# Patient Record
Sex: Male | Born: 1980 | Race: White | Hispanic: No | Marital: Married | Smoking: Never smoker
Health system: Southern US, Community
[De-identification: ages and names within clinical notes are randomized; demographics above are authoritative.]

## PROBLEM LIST (undated history)

## (undated) DIAGNOSIS — G4700 Insomnia, unspecified: Secondary | ICD-10-CM

## (undated) DIAGNOSIS — K219 Gastro-esophageal reflux disease without esophagitis: Secondary | ICD-10-CM

## (undated) DIAGNOSIS — N179 Acute kidney failure, unspecified: Secondary | ICD-10-CM

## (undated) DIAGNOSIS — R569 Unspecified convulsions: Secondary | ICD-10-CM

## (undated) DIAGNOSIS — R290 Tetany: Secondary | ICD-10-CM

## (undated) DIAGNOSIS — M5127 Other intervertebral disc displacement, lumbosacral region: Secondary | ICD-10-CM

## (undated) DIAGNOSIS — F4310 Post-traumatic stress disorder, unspecified: Secondary | ICD-10-CM

## (undated) DIAGNOSIS — E785 Hyperlipidemia, unspecified: Secondary | ICD-10-CM

## (undated) DIAGNOSIS — T754XXA Electrocution, initial encounter: Secondary | ICD-10-CM

## (undated) DIAGNOSIS — E039 Hypothyroidism, unspecified: Secondary | ICD-10-CM

## (undated) DIAGNOSIS — A419 Sepsis, unspecified organism: Secondary | ICD-10-CM

## (undated) DIAGNOSIS — M792 Neuralgia and neuritis, unspecified: Secondary | ICD-10-CM

## (undated) DIAGNOSIS — H532 Diplopia: Secondary | ICD-10-CM

## (undated) HISTORY — PX: LASIK: SHX215

---

## 1999-12-15 HISTORY — PX: CRANIOTOMY: SHX93

## 2006-02-15 ENCOUNTER — Emergency Department: Payer: Self-pay | Admitting: Unknown Physician Specialty

## 2006-07-20 ENCOUNTER — Emergency Department: Payer: Self-pay | Admitting: Emergency Medicine

## 2006-07-20 ENCOUNTER — Other Ambulatory Visit: Payer: Self-pay

## 2007-11-18 ENCOUNTER — Emergency Department: Payer: Self-pay | Admitting: Emergency Medicine

## 2008-03-31 ENCOUNTER — Ambulatory Visit: Payer: Self-pay | Admitting: Family Medicine

## 2008-04-04 ENCOUNTER — Ambulatory Visit: Payer: Self-pay

## 2010-03-25 ENCOUNTER — Emergency Department: Payer: Self-pay | Admitting: Emergency Medicine

## 2012-08-23 ENCOUNTER — Encounter: Payer: Self-pay | Admitting: Cardiovascular Disease

## 2014-02-17 ENCOUNTER — Ambulatory Visit: Payer: Self-pay | Admitting: Neurology

## 2014-07-04 ENCOUNTER — Telehealth: Payer: Self-pay | Admitting: Cardiovascular Disease

## 2014-07-04 NOTE — Telephone Encounter (Signed)
Per medical records, patient has not been seen since 07/22/2006  Spoke with patient and he c/o chest tightness on April 10th PM and 11th AM [place]. He wondered if it was related to the chlorine in the air? He states he felt like something was sitting on his chest. Patient was at rest when he noticed this. Patient noticed this in both pectoral muscles  Patient c/o more SOB than before - pollen or something else?  Patient c/o left chest discomfort that is reproducible with movement.  Patient reports he had some chest pain last nite when lying down - described as pain when he rolled over and hugged a pillow.   Dr. Hyacinth MeekerMiller (MD with [facility] [employer]) put patient on pravastatin about 1 month ago but was advised to hold this until further notice PCP Dr. Hyacinth MeekerMiller with [facility] [employer] can be contacted at [phone] if needed  Patient's father sees Dr. Allyson SabalBerry   Other medications  >>  testosterone 0.334ml injection Crissie FiguresQWeekly (Dr. Sharl MaKerr), pravastatin 40mg , was told to take fish oil and OTC acid reducer (takes prn)   Patient was originally scheduled on 07/26/14 with B. Sharol HarnessSimmons, [place] but patient will be a "new patient" to the practice and prefers to see Dr. Allyson SabalBerry  Will defer to Samara DeistKathryn, RN to see if there is any flexibility with adding on to Dr. Hazle CocaBerry's scheduled (4/29 8-830am is open)

## 2014-07-04 NOTE — Telephone Encounter (Signed)
Patient states he feels like something is sitting on his chest along with tightness.

## 2014-07-05 NOTE — Telephone Encounter (Signed)
Patient spoke with PCP who wanted him evaluated sooner, despite RN informing him that there was the possibility of Dr Allyson SabalBerry being able to see him sooner than June. Patient has been scheduled to see a cardiologist in [place] today at 150pm. He would like to continue any further cardiology care with Dr. Allyson SabalBerry

## 2014-07-05 NOTE — Telephone Encounter (Signed)
lmom 

## 2014-07-06 ENCOUNTER — Ambulatory Visit: Payer: Self-pay | Admitting: Cardiovascular Disease

## 2014-07-26 ENCOUNTER — Ambulatory Visit: Payer: Self-pay | Admitting: Cardiology

## 2019-04-12 ENCOUNTER — Ambulatory Visit: Payer: BC Managed Care – PPO | Attending: Internal Medicine

## 2019-04-12 DIAGNOSIS — Z20822 Contact with and (suspected) exposure to covid-19: Secondary | ICD-10-CM | POA: Insufficient documentation

## 2019-04-13 LAB — NOVEL CORONAVIRUS, NAA: SARS-CoV-2, NAA: NOT DETECTED

## 2019-10-06 ENCOUNTER — Other Ambulatory Visit: Payer: Self-pay | Admitting: Internal Medicine

## 2019-10-06 ENCOUNTER — Other Ambulatory Visit: Payer: Self-pay

## 2019-10-06 ENCOUNTER — Ambulatory Visit
Admission: RE | Admit: 2019-10-06 | Discharge: 2019-10-06 | Disposition: A | Discharge status: Home / Self Care | Payer: No Typology Code available for payment source | Source: Ambulatory Visit | Attending: Internal Medicine | Admitting: Internal Medicine

## 2019-10-06 ENCOUNTER — Other Ambulatory Visit (HOSPITAL_COMMUNITY): Payer: Self-pay | Admitting: Internal Medicine

## 2019-10-06 DIAGNOSIS — R1031 Right lower quadrant pain: Secondary | ICD-10-CM | POA: Diagnosis present

## 2019-10-06 DIAGNOSIS — K3531 Acute appendicitis with localized peritonitis and gangrene, without perforation: Secondary | ICD-10-CM | POA: Insufficient documentation

## 2019-10-06 MED ORDER — IOHEXOL 300 MG/ML  SOLN
100.0000 mL | Freq: Once | INTRAMUSCULAR | Status: AC | PRN
  Administered 2019-10-06: 100 mL via INTRAVENOUS

## 2019-11-17 ENCOUNTER — Emergency Department: Payer: Worker's Compensation

## 2019-11-17 ENCOUNTER — Inpatient Hospital Stay
Admission: EM | Admit: 2019-11-17 | Discharge: 2019-11-21 | DRG: 520 | Disposition: A | Discharge status: Home / Self Care | Payer: Worker's Compensation | Attending: Internal Medicine | Admitting: Internal Medicine

## 2019-11-17 ENCOUNTER — Other Ambulatory Visit: Payer: Self-pay

## 2019-11-17 ENCOUNTER — Encounter: Payer: Self-pay | Admitting: Internal Medicine

## 2019-11-17 DIAGNOSIS — M4727 Other spondylosis with radiculopathy, lumbosacral region: Secondary | ICD-10-CM | POA: Diagnosis present

## 2019-11-17 DIAGNOSIS — E860 Dehydration: Secondary | ICD-10-CM | POA: Diagnosis present

## 2019-11-17 DIAGNOSIS — M5127 Other intervertebral disc displacement, lumbosacral region: Secondary | ICD-10-CM

## 2019-11-17 DIAGNOSIS — M5116 Intervertebral disc disorders with radiculopathy, lumbar region: Secondary | ICD-10-CM | POA: Diagnosis present

## 2019-11-17 DIAGNOSIS — Z83438 Family history of other disorder of lipoprotein metabolism and other lipidemia: Secondary | ICD-10-CM

## 2019-11-17 DIAGNOSIS — D72829 Elevated white blood cell count, unspecified: Secondary | ICD-10-CM | POA: Diagnosis not present

## 2019-11-17 DIAGNOSIS — M5126 Other intervertebral disc displacement, lumbar region: Secondary | ICD-10-CM

## 2019-11-17 DIAGNOSIS — M5117 Intervertebral disc disorders with radiculopathy, lumbosacral region: Principal | ICD-10-CM | POA: Diagnosis present

## 2019-11-17 DIAGNOSIS — E785 Hyperlipidemia, unspecified: Secondary | ICD-10-CM | POA: Diagnosis present

## 2019-11-17 DIAGNOSIS — M5410 Radiculopathy, site unspecified: Secondary | ICD-10-CM

## 2019-11-17 DIAGNOSIS — Z885 Allergy status to narcotic agent status: Secondary | ICD-10-CM

## 2019-11-17 DIAGNOSIS — Z419 Encounter for procedure for purposes other than remedying health state, unspecified: Secondary | ICD-10-CM

## 2019-11-17 DIAGNOSIS — Z79899 Other long term (current) drug therapy: Secondary | ICD-10-CM

## 2019-11-17 DIAGNOSIS — M4726 Other spondylosis with radiculopathy, lumbar region: Secondary | ICD-10-CM | POA: Diagnosis present

## 2019-11-17 DIAGNOSIS — Z20822 Contact with and (suspected) exposure to covid-19: Secondary | ICD-10-CM | POA: Diagnosis present

## 2019-11-17 DIAGNOSIS — Z79890 Hormone replacement therapy (postmenopausal): Secondary | ICD-10-CM

## 2019-11-17 DIAGNOSIS — T380X5A Adverse effect of glucocorticoids and synthetic analogues, initial encounter: Secondary | ICD-10-CM | POA: Diagnosis not present

## 2019-11-17 DIAGNOSIS — M545 Low back pain: Secondary | ICD-10-CM | POA: Diagnosis not present

## 2019-11-17 LAB — BASIC METABOLIC PANEL
Anion gap: 12 (ref 5–15)
BUN: 14 mg/dL (ref 6–20)
CO2: 23 mmol/L (ref 22–32)
Calcium: 9.1 mg/dL (ref 8.9–10.3)
Chloride: 99 mmol/L (ref 98–111)
Creatinine, Ser: 1.06 mg/dL (ref 0.61–1.24)
GFR calc Af Amer: >60 mL/min (ref >60)
GFR calc non Af Amer: >60 mL/min (ref >60)
Glucose, Bld: 163 mg/dL — ABNORMAL HIGH (ref 70–99)
Potassium: 3.8 mmol/L (ref 3.5–5.1)
Sodium: 134 mmol/L — ABNORMAL LOW (ref 135–145)

## 2019-11-17 LAB — CBC WITH DIFFERENTIAL/PLATELET
Abs Immature Granulocytes: 0.05 10*3/uL (ref 0.00–0.07)
Basophils Absolute: 0 10*3/uL (ref 0.0–0.1)
Basophils Relative: 0 %
Eosinophils Absolute: 0.1 10*3/uL (ref 0.0–0.5)
Eosinophils Relative: 0 %
HCT: 48.4 % (ref 39.0–52.0)
Hemoglobin: 17.2 g/dL — ABNORMAL HIGH (ref 13.0–17.0)
Immature Granulocytes: 0 %
Lymphocytes Relative: 6 %
Lymphs Abs: 0.8 10*3/uL (ref 0.7–4.0)
MCH: 32.3 pg (ref 26.0–34.0)
MCHC: 35.5 g/dL (ref 30.0–36.0)
MCV: 91 fL (ref 80.0–100.0)
Monocytes Absolute: 0 10*3/uL — ABNORMAL LOW (ref 0.1–1.0)
Monocytes Relative: 0 %
Neutro Abs: 11.4 10*3/uL — ABNORMAL HIGH (ref 1.7–7.7)
Neutrophils Relative %: 94 %
Platelets: 255 10*3/uL (ref 150–400)
RBC: 5.32 MIL/uL (ref 4.22–5.81)
RDW: 12.9 % (ref 11.5–15.5)
WBC: 12.3 10*3/uL — ABNORMAL HIGH (ref 4.0–10.5)
nRBC: 0 % (ref 0.0–0.2)

## 2019-11-17 LAB — SARS CORONAVIRUS 2 BY RT PCR (HOSPITAL ORDER, PERFORMED IN ~~LOC~~ HOSPITAL LAB): SARS Coronavirus 2: NEGATIVE

## 2019-11-17 LAB — MAGNESIUM: Magnesium: 2 mg/dL (ref 1.7–2.4)

## 2019-11-17 MED ORDER — ENOXAPARIN SODIUM 40 MG/0.4ML ~~LOC~~ SOLN
40.0000 mg | SUBCUTANEOUS | Status: DC
  Administered 2019-11-17 – 2019-11-20 (×4): 40 mg via SUBCUTANEOUS
  Filled 2019-11-17 (×4): qty 0.4

## 2019-11-17 MED ORDER — METHYLPREDNISOLONE 4 MG PO TBPK: 4.0000 mg | ORAL_TABLET | ORAL | Status: DC

## 2019-11-17 MED ORDER — ONDANSETRON HCL 4 MG/2ML IJ SOLN
4.0000 mg | Freq: Four times a day (QID) | INTRAMUSCULAR | Status: DC | PRN
  Administered 2019-11-18 – 2019-11-20 (×3): 4 mg via INTRAVENOUS
  Filled 2019-11-17 (×2): qty 2

## 2019-11-17 MED ORDER — METHYLPREDNISOLONE 4 MG PO TBPK: 4.0000 mg | ORAL_TABLET | Freq: Four times a day (QID) | ORAL | Status: DC

## 2019-11-17 MED ORDER — FENTANYL CITRATE (PF) 100 MCG/2ML IJ SOLN
50.0000 ug | Freq: Once | INTRAMUSCULAR | Status: AC
  Administered 2019-11-17: 50 ug via INTRAVENOUS
  Filled 2019-11-17: qty 2

## 2019-11-17 MED ORDER — MAGNESIUM CITRATE PO SOLN
1.0000 | Freq: Once | ORAL | Status: DC | PRN
  Filled 2019-11-17: qty 296

## 2019-11-17 MED ORDER — SORBITOL 70 % SOLN
30.0000 mL | Freq: Every day | Status: DC | PRN
  Filled 2019-11-17: qty 30

## 2019-11-17 MED ORDER — ONDANSETRON HCL 4 MG PO TABS
4.0000 mg | ORAL_TABLET | Freq: Four times a day (QID) | ORAL | Status: DC | PRN
  Filled 2019-11-17: qty 1

## 2019-11-17 MED ORDER — METHYLPREDNISOLONE 4 MG PO TBPK: 8.0000 mg | ORAL_TABLET | Freq: Every morning | ORAL | Status: DC

## 2019-11-17 MED ORDER — METHYLPREDNISOLONE 4 MG PO TBPK: 4.0000 mg | ORAL_TABLET | Freq: Three times a day (TID) | ORAL | Status: DC

## 2019-11-17 MED ORDER — ORPHENADRINE CITRATE 30 MG/ML IJ SOLN
60.0000 mg | Freq: Two times a day (BID) | INTRAMUSCULAR | Status: DC
  Administered 2019-11-17: 60 mg via INTRAMUSCULAR
  Filled 2019-11-17: qty 2

## 2019-11-17 MED ORDER — SENNA 8.6 MG PO TABS
1.0000 | ORAL_TABLET | Freq: Two times a day (BID) | ORAL | Status: DC
  Administered 2019-11-17 – 2019-11-21 (×6): 8.6 mg via ORAL
  Filled 2019-11-17 (×7): qty 1

## 2019-11-17 MED ORDER — PANTOPRAZOLE SODIUM 40 MG PO TBEC
40.0000 mg | DELAYED_RELEASE_TABLET | Freq: Every day | ORAL | Status: DC
  Administered 2019-11-17 – 2019-11-21 (×4): 40 mg via ORAL
  Filled 2019-11-17 (×4): qty 1

## 2019-11-17 MED ORDER — METHYLPREDNISOLONE 4 MG PO TBPK
8.0000 mg | ORAL_TABLET | Freq: Every evening | ORAL | Status: DC
Start: 2019-11-17 — End: 2019-11-17

## 2019-11-17 MED ORDER — METHYLPREDNISOLONE 4 MG PO TBPK
8.0000 mg | ORAL_TABLET | Freq: Every evening | ORAL | Status: DC
Start: 2019-11-18 — End: 2019-11-17

## 2019-11-17 MED ORDER — POLYETHYLENE GLYCOL 3350 17 G PO PACK: 17.0000 g | PACK | Freq: Every day | ORAL | Status: DC | PRN

## 2019-11-17 MED ORDER — PREDNISONE 20 MG PO TABS
40.0000 mg | ORAL_TABLET | Freq: Every day | ORAL | Status: AC
  Administered 2019-11-18 – 2019-11-20 (×3): 40 mg via ORAL
  Filled 2019-11-17 (×3): qty 2

## 2019-11-17 MED ORDER — METHOCARBAMOL 1000 MG/10ML IJ SOLN
500.0000 mg | Freq: Four times a day (QID) | INTRAVENOUS | Status: DC | PRN
  Filled 2019-11-17 (×3): qty 5

## 2019-11-17 MED ORDER — FENTANYL CITRATE (PF) 100 MCG/2ML IJ SOLN
100.0000 ug | Freq: Once | INTRAMUSCULAR | Status: AC
  Administered 2019-11-17: 100 ug via INTRAVENOUS
  Filled 2019-11-17: qty 2

## 2019-11-17 MED ORDER — HYDRALAZINE HCL 20 MG/ML IJ SOLN: 10.0000 mg | Freq: Four times a day (QID) | INTRAMUSCULAR | Status: DC | PRN

## 2019-11-17 MED ORDER — PREGABALIN 75 MG PO CAPS
75.0000 mg | ORAL_CAPSULE | Freq: Two times a day (BID) | ORAL | Status: DC
  Administered 2019-11-17 – 2019-11-21 (×6): 75 mg via ORAL
  Filled 2019-11-17 (×7): qty 1

## 2019-11-17 MED ORDER — METHYLPREDNISOLONE SODIUM SUCC 125 MG IJ SOLR
125.0000 mg | Freq: Once | INTRAMUSCULAR | Status: AC
  Administered 2019-11-17: 125 mg via INTRAMUSCULAR
  Filled 2019-11-17: qty 2

## 2019-11-17 MED ORDER — METHYLPREDNISOLONE SODIUM SUCC 125 MG IJ SOLR
60.0000 mg | Freq: Once | INTRAMUSCULAR | Status: AC
  Administered 2019-11-17: 60 mg via INTRAVENOUS
  Filled 2019-11-17: qty 2

## 2019-11-17 MED ORDER — KETOROLAC TROMETHAMINE 30 MG/ML IJ SOLN
30.0000 mg | Freq: Four times a day (QID) | INTRAMUSCULAR | Status: DC | PRN
  Administered 2019-11-17 – 2019-11-20 (×8): 30 mg via INTRAVENOUS
  Filled 2019-11-17 (×8): qty 1

## 2019-11-17 MED ORDER — IBUPROFEN 400 MG PO TABS
800.0000 mg | ORAL_TABLET | Freq: Three times a day (TID) | ORAL | Status: DC
  Administered 2019-11-17: 800 mg via ORAL
  Filled 2019-11-17: qty 1

## 2019-11-17 MED ORDER — ROSUVASTATIN CALCIUM 10 MG PO TABS
20.0000 mg | ORAL_TABLET | Freq: Every evening | ORAL | Status: DC
  Administered 2019-11-18 – 2019-11-20 (×3): 20 mg via ORAL
  Filled 2019-11-17 (×2): qty 2
  Filled 2019-11-17: qty 1
  Filled 2019-11-17: qty 2

## 2019-11-17 MED ORDER — ONDANSETRON HCL 4 MG/2ML IJ SOLN
4.0000 mg | Freq: Once | INTRAMUSCULAR | Status: AC
  Administered 2019-11-17: 4 mg via INTRAVENOUS
  Filled 2019-11-17: qty 2

## 2019-11-17 NOTE — ED Notes (Signed)
See triage note  Presents via EMS with back pain  Pain became worse last pm  Hx of bulging discs
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
## 2019-11-17 NOTE — Consult Note (Signed)
Referring Physician:  No referring provider defined for this encounter.  Primary Physician:  Danella Penton, MD  Chief Complaint:  Lower back pain and RLE pain  History of Present Illness: Jason Conner is a 39 y.o. male who presents with the chief complaint of LBP and RLE radiculopathy. He reports that he had been going about his normal routine and yesterday, he felt a sharp pain in his lower back with immediate shooting, burning pain down his right posterior thigh, calf, and into his right foot.  He reports that the pain was so intense that he was unable to hold himself up on his right leg.  Since that time, he has been unable to find a position of comfort.  He feels that any movement to his right lower extremity causes increase in his lower back and right lower extremity pain.  He denies any overt weakness, however he is avoiding moving his right leg due to the severe pain that he is experiencing.  He does endorse a similar episode of this in July of last year, he was treated at [facility] with steroid injections which he said did help, and he underwent physical therapy and since that time he had been doing well.  Denies any injury or inciting event, although upon recollection, thinks he may have lifted a heavier object yesterday evening which may have triggered the pain he is experiencing now.  He denies any saddle anesthesia, denies any incontinence or difficulty urinating, and denies any incontinence of stool.  He also endorses some LLE pain but this is minimal in comparison to his RLE pain.  The symptoms are causing a significant impact on the patient's life.   Review of Systems:  A 10 point review of systems is negative, except for the pertinent positives and negatives detailed in the HPI.  Past Medical History: History reviewed. No pertinent past medical history.   Past Surgical History: History reviewed. No pertinent surgical history.  Allergies: Allergies as of 11/17/2019  - Review Complete 11/17/2019  Allergen Reaction Noted  . Morphine and related Nausea And Vomiting 11/17/2019    Medications:  Current Facility-Administered Medications:  .  orphenadrine (NORFLEX) injection 60 mg, 60 mg, Intramuscular, Q12H, Joni Reining, PA-C, 60 mg at 11/17/19 1159  Current Outpatient Medications:  .  omeprazole (PRILOSEC) 20 MG capsule, Take 20 mg by mouth daily., Disp: , Rfl:  .  rosuvastatin (CRESTOR) 20 MG tablet, Take 20 mg by mouth daily., Disp: , Rfl:  .  testosterone cypionate (DEPOTESTOSTERONE CYPIONATE) 200 MG/ML injection, Inject 0.5 mLs into the muscle once a week., Disp: , Rfl:    Social History: Social History   Tobacco Use  . Smoking status: Not on file  Substance Use Topics  . Alcohol use: Not Currently  . Drug use: Never    Family Medical History: History reviewed. No pertinent family history.  Physical Examination: Vitals:   11/17/19 1630 11/17/19 1700  BP: (!) 146/107 131/88  Pulse: 91 81  Resp:    Temp:    SpO2: 93% 95%     General: Patient is well developed, well nourished, in moderate to severe pain. Psychiatric: Patient is anxious.   NEUROLOGICAL:  General: In mild-moderate distress due to pain.  Awake, alert, oriented to person, place, and time.  Pupils equal round and reactive to light.    Tongue protrusion is midline.  There is no pronator drift.  ROM of spine: deferred due to pain. Palpation of spine: TTP over lumbar  spine and R buttock.    Strength: Exam greatly pain and effort limited but full strength. Pt had received pain medication at time of exam.   Side Iliopsoas Quads Hamstring PF DF EHL  R 5 5 5 5 5 5   L 5 5 5 5 5 5    Reflexes are 1+ and symmetric at patella and achilles.   Bilateral upper and lower extremity sensation is intact to light touch.  Clonus is not present.    Gait deferred due to pain. Rectal tone intact (chaperone present). Sensation to rectal area and buttocks is intact.     Imaging: Narrative & Impression  CLINICAL DATA:  Progressive low back pain radiating to the left leg.  EXAM: MRI LUMBAR SPINE WITHOUT CONTRAST  TECHNIQUE: Multiplanar, multisequence MR imaging of the lumbar spine was performed. No intravenous contrast was administered.  COMPARISON:  Radiography 11/17/2019  FINDINGS: Segmentation:  5 lumbar type vertebral bodies.  Alignment:  Normal  Vertebrae:  Normal  Conus medullaris and cauda equina: Conus extends to the L1-2 level. Conus and cauda equina appear normal.  Paraspinal and other soft tissues: Normal  Disc levels:  No abnormality at L2-3 or above.  L3-4: Mild desiccation of the disc. Annular bulging with a central annular fissure. No HNP. No compressive stenosis.  L4-5: Disc degeneration with annular bulging and annular fissures. Slight indentation of the thecal sac but no compressive stenosis.  L5-S1: Right paracentral disc herniation with displacement of the right S1 nerve. No left-sided compressive pathology.  IMPRESSION: 1. 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 This could be a cause of right-sided symptoms. 2. L3-4 and L4-5 disc degeneration with annular bulging and annular fissures. No compressive stenosis at those levels. Findings could relate to back pain or neural irritation. 3. No discrete cause of left radicular symptoms is identified.      Assessment and Plan: Jason Conner is a pleasant 39 y.o. male with lumbar radiculopathy and MRI with evidence of R L5-S1 disc herniation.   Reassuringly, there is no evidence of cauda equina symptoms on exam. He is neurovascularly intact, and displays full strength throughout lower extremities once pain was controlled.   There is no acute neurosurgical intervention at this time.  We recommend conservative management including pain management, Medrol dosepak.   He and his wife are electing to be admitted  for pain management. While he is admitted, please consider a PT referral to work with him.  If his exam changes and he is no longer neurovascularly intact, or develops bowel/bladder dysfunction or saddle anesthesia, please contact neurosurgery immediately.   Otherwise, we will plan to have him return to clinic in two weeks for a follow up with me. We will also arrange for him to receive a fast track ESI with our physiatry department and place a referral for physical therapy.    I have discussed the above with Dr. Deneen Harts.   46, NP Dept. of Neurosurgery

## 2019-11-17 NOTE — ED Triage Notes (Signed)
Pt in via EMS from home with c/o lower back pain radiating down the left leg. Pt with hx of S5, L1 herniation. Pain started yesterday and worsened today. Pt was given 75 mcg fentanyl abd 4mg  of zofran,. #20g to left forearm

## 2019-11-17 NOTE — ED Notes (Signed)
Admission hold area currently full.

## 2019-11-17 NOTE — ED Provider Notes (Signed)
[facility] Emergency Department Provider Note   ____________________________________________   First MD Initiated Contact with Patient 11/17/19 1048     (approximate)  I have reviewed the triage vital signs and the nursing notes.   HISTORY  Chief Complaint Back Pain     HPI DAREY HERSHBERGER III is a 39 y.o. male patient arrived via EMS complaining of radicular low back pain to the left lower extremity.  Patient denies bladder or bowel dysfunction.  Patient has a history of degenerative disc disease with bulges.  Patient also stated there was a herniation at L1.  Patient was followed by [facility] last year and improved with physical therapy.  Patient states yesterday bending/ lifting incident which increases his pain.  Patient was given 75 mg of fentanyl in route to ED.  Patient states slept on the floor last night and was unable to get up secondary to to the radicular pain to his  lower extremity. Patient was assisted into the stretcher by EMS prior to arrival.  Patient state his wife had called [facility] earlier today and he was scheduled for 1230 appointment but when he was unable to get up decided come to the emergency room.  Patient rates pain as a 10/10.  Patient described pain as "sharp/aching".         No past medical history on file.  There are no problems to display for this patient.     Prior to Admission medications   Medication Sig Start Date End Date Taking? Authorizing Provider  rosuvastatin (CRESTOR) 20 MG tablet Take 20 mg by mouth daily. 10/01/19   [provider]  testosterone cypionate (DEPOTESTOSTERONE CYPIONATE) 200 MG/ML injection Inject 0.5 mLs into the muscle once a week. 10/19/19   [provider]    Allergies Morphine and related  No family history on file.  Social History Social History   Tobacco Use  . Smoking status: Not on file  Substance Use Topics  . Alcohol use: Not on file  . Drug use: Not  on file    Review of Systems Constitutional: No fever/chills Eyes: No visual changes. ENT: No sore throat. Cardiovascular: Denies chest pain. Respiratory: Denies shortness of breath. Gastrointestinal: No abdominal pain.  No nausea, no vomiting.  No diarrhea.  No constipation. Genitourinary: Negative for dysuria. Musculoskeletal: Positive for back pain. Skin: Negative for rash. Neurological: Focal weakness to the left lower extremity.   Endocrine:  Hyperlipidemia Hematological/Lymphatic:  Allergic/Immunilogical: Morphine and related products. ____________________________________________   PHYSICAL EXAM:  VITAL SIGNS: ED Triage Vitals  Enc Vitals Group     BP 11/17/19 1028 118/79     Pulse Rate 11/17/19 1028 90     Resp 11/17/19 1028 18     Temp 11/17/19 1028 98.6 F (37 C)     Temp Source 11/17/19 1028 Oral     SpO2 11/17/19 1028 96 %     Weight 11/17/19 1029 230 lb (104.3 kg)     Height 11/17/19 1029 6\' 3"  (1.905 m)     Head Circumference --      Peak Flow --      Pain Score 11/17/19 1029 10     Pain Loc --      Pain Edu? --      Excl. in GC? --     Constitutional: Alert and oriented.  Moderate distress.   Eyes: Conjunctivae are normal. PERRL. EOMI. Head: Atraumatic. Nose: No congestion/rhinnorhea. Mouth/Throat: Mucous membranes are moist.  Oropharynx non-erythematous.  Neck:No cervical spine tenderness to palpation. Hematological/Lymphatic/Immunilogical: No cervical lymphadenopathy. Cardiovascular: Normal rate, regular rhythm. Grossly normal heart sounds.  Good peripheral circulation. Respiratory: Normal respiratory effort.  No retractions. Lungs CTAB. Musculoskeletal: No obvious deformity lower extremity.  Patient unable to sit and stand secondary to complaint of pain.  No lower extremity tenderness nor edema.  No joint effusions. Neurologic:  Normal speech and language.  Weakness to the left lower extremity.  Able to sit and stand at this time. Skin:  Skin is  warm, dry and intact. No rash noted. Psychiatric: Mood and affect are normal. Speech and behavior are normal.  ____________________________________________   LABS (all labs ordered are listed, but only abnormal results are displayed)  Labs Reviewed  SARS CORONAVIRUS 2 BY RT PCR (HOSPITAL ORDER, PERFORMED IN [facility])  CBC WITH DIFFERENTIAL/PLATELET  BASIC METABOLIC PANEL   ____________________________________________  EKG   ____________________________________________  RADIOLOGY  ED MD interpretation:    Official radiology report(s): DG Lumbar Spine 2-3 Views  Result Date: 11/17/2019 CLINICAL DATA:  Radicular back pain of left lower extremity. Additional history provided: Patient reports history of bulging disc at L5 and S1, lifting injury. EXAM: LUMBAR SPINE - 2-3 VIEW COMPARISON:  Lumbar dextrocurvature. FINDINGS: Five lumbar vertebrae. Mild lumbar dextrocurvature. Trace retrolisthesis at T12-L1, L1-L2, L2-L3, L3-L4, L4-L5 and L5-S1. No lumbar compression fracture. Mild multilevel disc space narrowing greatest at L4-L5 and L5-S1. Mild lower lumbar facet arthrosis. IMPRESSION: No evidence of lumbar compression fracture. Mild lumbar dextrocurvature. Trace multilevel grade 1 retrolisthesis. Lumbar spondylosis greatest at L4-L5 and L5-S1. Electronically Signed   By: Jackey Loge DO   On: 11/17/2019 11:30   MR LUMBAR SPINE WO CONTRAST  Result Date: 11/17/2019 CLINICAL DATA:  Progressive low back pain radiating to the left leg. EXAM: MRI LUMBAR SPINE WITHOUT CONTRAST TECHNIQUE: Multiplanar, multisequence MR imaging of the lumbar spine was performed. No intravenous contrast was administered. COMPARISON:  Radiography 11/17/2019 FINDINGS: Segmentation:  5 lumbar type vertebral bodies. Alignment:  Normal Vertebrae:  Normal Conus medullaris and cauda equina: Conus extends to the L1-2 level. Conus and cauda equina appear normal. Paraspinal and other soft tissues: Normal Disc  levels: No abnormality at L2-3 or above. L3-4: Mild desiccation of the disc. Annular bulging with a central annular fissure. No HNP. No compressive stenosis. L4-5: Disc degeneration with annular bulging and annular fissures. Slight indentation of the thecal sac but no compressive stenosis. L5-S1: Right paracentral disc herniation with displacement of the right S1 nerve. No left-sided compressive pathology. IMPRESSION: 1. 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 This could be a cause of right-sided symptoms. 2. L3-4 and L4-5 disc degeneration with annular bulging and annular fissures. No compressive stenosis at those levels. Findings could relate to back pain or neural irritation. 3. No discrete cause of left radicular symptoms is identified. Electronically Signed   By: Paulina Fusi M.D.   On: 11/17/2019 12:41    ____________________________________________   PROCEDURES  Procedure(s) performed (including Critical Care):  Procedures   ____________________________________________   INITIAL IMPRESSION / ASSESSMENT AND PLAN / ED COURSE  As part of my medical decision making, I reviewed the following data within the electronic MEDICAL RECORD NUMBER     Patient presents with radicular back pain with acute onset last night.  Discussed x-ray and MRI findings with patient consistent with disc space narrowing and a right paracentral disc herniation at L5-S1 along with this patient S1 nerve.  Discussed MRI findings  with neurosurgery who did evaluate the patient and advised no surgical intervention.  Recommend admission for pain control.  Neurosurgeon will also generate consult for PT.        ____________________________________________   FINAL CLINICAL IMPRESSION(S) / ED DIAGNOSES  Final diagnoses:  Lumbar disc herniation     ED Discharge Orders    None       Note:  This document was prepared using Dragon voice recognition software and may include  unintentional dictation errors.    Joni Reining, PA-C 11/17/19 1723    Dionne Bucy, MD 11/20/19 [id number]

## 2019-11-17 NOTE — ED Triage Notes (Signed)
Pt states hx of bulging disc at L5 and S1, pt states that he lifted something small and started feeling sore in his back yesterday evening, states that he slept in the floor all night and has been having pain radiation down the right leg, pt states that he was unable to get up this am due to the severity of the pain this am, pt is lying on his left side currently in a recliner

## 2019-11-18 ENCOUNTER — Encounter: Payer: Self-pay | Admitting: Internal Medicine

## 2019-11-18 DIAGNOSIS — E860 Dehydration: Secondary | ICD-10-CM

## 2019-11-18 LAB — BASIC METABOLIC PANEL
Anion gap: 8 (ref 5–15)
BUN: 19 mg/dL (ref 6–20)
CO2: 24 mmol/L (ref 22–32)
Calcium: 9.2 mg/dL (ref 8.9–10.3)
Chloride: 101 mmol/L (ref 98–111)
Creatinine, Ser: 1.35 mg/dL — ABNORMAL HIGH (ref 0.61–1.24)
GFR calc Af Amer: >60 mL/min (ref >60)
GFR calc non Af Amer: >60 mL/min (ref >60)
Glucose, Bld: 166 mg/dL — ABNORMAL HIGH (ref 70–99)
Potassium: 4.5 mmol/L (ref 3.5–5.1)
Sodium: 133 mmol/L — ABNORMAL LOW (ref 135–145)

## 2019-11-18 LAB — PROTIME-INR
INR: 0.9 (ref 0.8–1.2)
Prothrombin Time: 12 seconds (ref 11.4–15.2)

## 2019-11-18 LAB — CBC
HCT: 46.7 % (ref 39.0–52.0)
Hemoglobin: 17.2 g/dL — ABNORMAL HIGH (ref 13.0–17.0)
MCH: 33 pg (ref 26.0–34.0)
MCHC: 36.8 g/dL — ABNORMAL HIGH (ref 30.0–36.0)
MCV: 89.6 fL (ref 80.0–100.0)
Platelets: 282 10*3/uL (ref 150–400)
RBC: 5.21 MIL/uL (ref 4.22–5.81)
RDW: 13.2 % (ref 11.5–15.5)
WBC: 12.7 10*3/uL — ABNORMAL HIGH (ref 4.0–10.5)
nRBC: 0 % (ref 0.0–0.2)

## 2019-11-18 LAB — HIV ANTIBODY (ROUTINE TESTING W REFLEX): HIV Screen 4th Generation wRfx: NONREACTIVE

## 2019-11-18 MED ORDER — IBUPROFEN 400 MG PO TABS
800.0000 mg | ORAL_TABLET | Freq: Three times a day (TID) | ORAL | Status: DC
  Administered 2019-11-18 – 2019-11-20 (×7): 800 mg via ORAL
  Filled 2019-11-18 (×9): qty 2

## 2019-11-18 MED ORDER — METHOCARBAMOL 1000 MG/10ML IJ SOLN
1000.0000 mg | Freq: Four times a day (QID) | INTRAVENOUS | Status: DC | PRN
  Filled 2019-11-18: qty 10

## 2019-11-18 MED ORDER — METHOCARBAMOL 500 MG PO TABS
1000.0000 mg | ORAL_TABLET | Freq: Four times a day (QID) | ORAL | Status: DC | PRN
  Administered 2019-11-19 – 2019-11-20 (×2): 1000 mg via ORAL
  Filled 2019-11-18 (×2): qty 2

## 2019-11-18 MED ORDER — ALUM & MAG HYDROXIDE-SIMETH 200-200-20 MG/5ML PO SUSP
30.0000 mL | ORAL | Status: DC | PRN
  Administered 2019-11-18: 30 mL via ORAL
  Filled 2019-11-18: qty 30

## 2019-11-18 MED ORDER — SODIUM CHLORIDE 0.9 % IV SOLN: INTRAVENOUS | Status: AC
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
## 2019-11-18 NOTE — Progress Notes (Addendum)
PROGRESS NOTE    Jason Conner  FUX:[id number] DOB: 24-Jun-1980 DOA: 11/17/2019 PCP: Danella Penton, MD    Chief Complaint  Patient presents with  . Back Pain    Brief Narrative:  Patient is a pleasant 39 year old gentleman, history of lumbar disc herniation, history of right frontal craniotomy, hyperlipidemia presenting with sudden onset low back pain with radiation to the right lower extremity.  MRI L-spine consistent with L5-S1 disc herniation.  Patient admitted for pain management.  Patient seen in consultation by neurosurgery who are recommending conservative treatment at this time with outpatient follow-up.   Assessment & Plan:   Principal Problem:   Lumbosacral disc herniation Active Problems:   Radicular pain of lower extremity   Hyperlipidemia  1 right paracentral disc herniation at L5-S1 with displacement of right S1 nerve/radicular pain Patient presented with lower back pain with radiation to the right leg/radicular symptoms.  Patient with prior history of disc herniation.  Plain films of the lumbar spine unremarkable.  MRI of the L-spine with right paracentral disc herniation at L5-S1 with displacement of the right S1 nerve, L3-4 L4-5 degeneration with annular bulging and annular fissures.  Patient noted to have had prior symptoms of herniation in the past approximately a year ago which was treated with epidural injection and physical therapy.  Patient seen in consultation by neurosurgical PA who feels no acute neurosurgical intervention at this time and recommending conservative management including pain management and Medrol Dosepak. Patient placed on scheduled ibuprofen 800 mg 3 times daily x4 more days, continue Lyrica 75 mg twice daily, prednisone taper, IV Toradol as needed.  Patient still with significant low back pain still requiring IV pain medication and pain currently not adequately controlled for discharge.  Will also place on as needed Robaxin.  PT/OT. Once  patient is discharged will need outpatient follow-up with neurosurgery in 2 weeks per their recommendation.  Supportive care.  Follow.  2.  Hyperlipidemia Continue statin.  3.  Dehydration Place on IV fluids for the next 24 hours.   DVT prophylaxis: Lovenox Code Status: Full Family Communication: Updated patient and wife at bedside. Disposition:   Status is: Observation    Dispo: The patient is from: Home              Anticipated d/c is to: Home              Anticipated d/c date is: Hopefully tomorrow, 11/19/2019              Patient currently with a right L5-S1 disc herniation currently undergoing pain management, PT evaluation, receiving IV pain medication.  Currently not medically stable.       Consultants:   Neurosurgery: Patsey Berthold, NP 11/17/2019  Procedures:   Pain films of the L-spine 11/17/2019  MRI of the L-spine 11/17/2019  Antimicrobials:   None   Subjective: Patient sitting up on the side of the bed about to work with occupational therapy.  Stated IV Toradol helped his pain longer and better than fentanyl that was given in the ED.  Still with significant low back pain however slowly improving.  Just worked with physical therapy.  Objective: Vitals:   11/18/19 0108 11/18/19 0500 11/18/19 0517 11/18/19 0900  BP: (!) 106/53  115/72 129/67  Pulse: 94  88 83  Resp: 18  20 18   Temp: 97.7 F (36.5 C)  98.2 F (36.8 C) 97.6 F (36.4 C)  TempSrc: Oral  Oral Oral  SpO2: 97%  94%  97%  Weight:  105.7 kg    Height:        Intake/Output Summary (Last 24 hours) at 11/18/2019 1230 Last data filed at 11/18/2019 1021 Gross per 24 hour  Intake 240 ml  Output 300 ml  Net -60 ml   Filed Weights   11/17/19 1029 11/18/19 0500  Weight: 104.3 kg 105.7 kg    Examination:  General exam: NAD Respiratory system: CTAB.  No wheezes, no crackles, no rhonchi.  Normal respiratory effort.   Cardiovascular system: Regular rate rhythm no murmurs rubs or gallops.  No JVD.   No lower extremity edema. Gastrointestinal system: Abdomen is soft, nontender, nondistended, positive bowel sounds.  No rebound.  No guarding.   Central nervous system: Alert and oriented. No focal neurological deficits. Extremities: Symmetric 5 x 5 power. Skin: No rashes, lesions or ulcers Psychiatry: Judgement and insight appear normal. Mood & affect appropriate.  Musculoskeletal: Lumbar region/paraspinous muscles tenderness to palpation.    Data Reviewed: I have personally reviewed following labs and imaging studies  CBC: Recent Labs  Lab 11/17/19 1748 11/18/19 0651  WBC 12.3* 12.7*  NEUTROABS 11.4*  --   HGB 17.2* 17.2*  HCT 48.4 46.7  MCV 91.0 89.6  PLT 255 282    Basic Metabolic Panel: Recent Labs  Lab 11/17/19 1748 11/17/19 2144 11/18/19 0651  NA 134*  --  133*  K 3.8  --  4.5  CL 99  --  101  CO2 23  --  24  GLUCOSE 163*  --  166*  BUN 14  --  19  CREATININE 1.06  --  1.35*  CALCIUM 9.1  --  9.2  MG  --  2.0  --     GFR: Estimated Creatinine Clearance: 94.7 mL/min (A) (by C-G formula based on SCr of 1.35 mg/dL (H)).  Liver Function Tests: No results for input(s): AST, ALT, ALKPHOS, BILITOT, PROT, ALBUMIN in the last 168 hours.  CBG: No results for input(s): GLUCAP in the last 168 hours.   Recent Results (from the past 240 hour(s))  SARS Coronavirus 2 by RT PCR (hospital order, performed in [facility] hospital lab) Nasopharyngeal Nasopharyngeal Swab     Status: None   Collection Time: 11/17/19  3:50 PM   Specimen: Nasopharyngeal Swab  Result Value Ref Range Status   SARS Coronavirus 2 NEGATIVE NEGATIVE Final    Comment: (NOTE) SARS-CoV-2 target nucleic acids are NOT DETECTED.  The SARS-CoV-2 RNA is generally detectable in upper and lower respiratory specimens during the acute phase of infection. The lowest concentration of SARS-CoV-2 viral copies this assay can detect is 250 copies / mL. A negative result does not preclude SARS-CoV-2  infection and should not be used as the sole basis for treatment or other patient management decisions.  A negative result may occur with improper specimen collection / handling, submission of specimen other than nasopharyngeal swab, presence of viral mutation(s) within the areas targeted by this assay, and inadequate number of viral copies (<250 copies / mL). A negative result must be combined with clinical observations, patient history, and epidemiological information.  Fact Sheet for Patients:   BoilerBrush.com.cy  Fact Sheet for Healthcare Providers: https://pope.com/  This test is not yet approved or  cleared by the [place] FDA and has been authorized for detection and/or diagnosis of SARS-CoV-2 by FDA under an Emergency Use Authorization (EUA).  This EUA will remain in effect (meaning this test can be used) for the duration of the COVID-19 declaration  under Section 564(b)(1) of the Act, 21 U.S.C. section 360bbb-3(b)(1), unless the authorization is terminated or revoked sooner.  Performed at [facility], [address], [place], [place] [address]          Radiology Studies: DG Lumbar Spine 2-3 Views  Result Date: 11/17/2019 CLINICAL DATA:  Radicular back pain of left lower extremity. Additional history provided: Patient reports history of bulging disc at L5 and S1, lifting injury. EXAM: LUMBAR SPINE - 2-3 VIEW COMPARISON:  Lumbar dextrocurvature. FINDINGS: Five lumbar vertebrae. Mild lumbar dextrocurvature. Trace retrolisthesis at T12-L1, L1-L2, L2-L3, L3-L4, L4-L5 and L5-S1. No lumbar compression fracture. Mild multilevel disc space narrowing greatest at L4-L5 and L5-S1. Mild lower lumbar facet arthrosis. IMPRESSION: No evidence of lumbar compression fracture. Mild lumbar dextrocurvature. Trace multilevel grade 1 retrolisthesis. Lumbar spondylosis greatest at L4-L5 and L5-S1. Electronically Signed   By: Jackey Loge DO   On: 11/17/2019 11:30   MR LUMBAR SPINE WO CONTRAST  Result Date: 11/17/2019 CLINICAL DATA:  Progressive low back pain radiating to the left leg. EXAM: MRI LUMBAR SPINE WITHOUT CONTRAST TECHNIQUE: Multiplanar, multisequence MR imaging of the lumbar spine was performed. No intravenous contrast was administered. COMPARISON:  Radiography 11/17/2019 FINDINGS: Segmentation:  5 lumbar type vertebral bodies. Alignment:  Normal Vertebrae:  Normal Conus medullaris and cauda equina: Conus extends to the L1-2 level. Conus and cauda equina appear normal. Paraspinal and other soft tissues: Normal Disc levels: No abnormality at L2-3 or above. L3-4: Mild desiccation of the disc. Annular bulging with a central annular fissure. No HNP. No compressive stenosis. L4-5: Disc degeneration with annular bulging and annular fissures. Slight indentation of the thecal sac but no compressive stenosis. L5-S1: Right paracentral disc herniation with displacement of the right S1 nerve. No left-sided compressive pathology. IMPRESSION: 1. 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 This could be a cause of right-sided symptoms. 2. L3-4 and L4-5 disc degeneration with annular bulging and annular fissures. No compressive stenosis at those levels. Findings could relate to back pain or neural irritation. 3. No discrete cause of left radicular symptoms is identified. Electronically Signed   By: Paulina Fusi M.D.   On: 11/17/2019 12:41        Scheduled Meds: . enoxaparin (LOVENOX) injection  40 mg Subcutaneous Q24H  . ibuprofen  800 mg Oral TID  . pantoprazole  40 mg Oral Daily  . predniSONE  40 mg Oral QAC breakfast  . pregabalin  75 mg Oral BID  . rosuvastatin  20 mg Oral QPM  . senna  1 tablet Oral BID   Continuous Infusions: . sodium chloride 125 mL/hr at 11/18/19 1104  . methocarbamol (ROBAXIN) IV       LOS: 0 days    Time spent: 35 minutes    Ramiro Harvest, MD [facility]   To contact the attending provider between 7A-7P or the covering provider during after hours 7P-7A, please log into the web site [email or web address] and access using universal [place] password for that web site. If you do not have the password, please call the hospital operator.  11/18/2019, 12:30 PM
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
## 2019-11-18 NOTE — Progress Notes (Signed)
Patient laying on stomach currently to help with back pain. Heating pad offered for pain relief, patient wants to wait a bit to try it. Wife at bedside. No complaints at this time. IVF infusing.
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
## 2019-11-18 NOTE — Progress Notes (Signed)
   11/18/19 2130  Assess: MEWS Score  BP (!) 157/87  Pulse Rate (!) 108  SpO2 100 %  Assess: MEWS Score  MEWS Temp 0  MEWS Systolic 0  MEWS Pulse 1  MEWS RR 0  MEWS LOC 0  MEWS Score 1  MEWS Score Color Green  Assess: if the MEWS score is Yellow or Red  Were vital signs taken at a resting state? Yes  Focused Assessment Change from prior assessment (see assessment flowsheet)  Early Detection of Sepsis Score *See Row Information* Medium  MEWS guidelines implemented *See Row Information* Yes  Treat  MEWS Interventions Other (Comment) (Notified doctor)  Pain Scale 0-10  Pain Score 0  Take Vital Signs  Increase Vital Sign Frequency  Yellow: Q 2hr X 2 then Q 4hr X 2, if remains yellow, continue Q 4hrs  Escalate  MEWS: Escalate Yellow: discuss with charge nurse/RN and consider discussing with provider and RRT  Notify: Charge Nurse/RN  Name of Charge Nurse/RN Notified Sheron Nightingale  Date Charge Nurse/RN Notified 11/18/19  Time Charge Nurse/RN Notified 2143  Notify: Provider  Provider Name/Title Ouma NP  Date Provider Notified 11/18/19  Time Provider Notified 2121  Notification Type  (secure chat)  Notification Reason Change in status (MEWS score 2)  Response Other (Comment) (she responded "Noted")  Date of Provider Response 11/18/19  Time of Provider Response 2201

## 2019-11-19 DIAGNOSIS — Z20822 Contact with and (suspected) exposure to covid-19: Secondary | ICD-10-CM | POA: Diagnosis present

## 2019-11-19 DIAGNOSIS — E785 Hyperlipidemia, unspecified: Secondary | ICD-10-CM | POA: Diagnosis present

## 2019-11-19 DIAGNOSIS — M545 Low back pain: Secondary | ICD-10-CM | POA: Diagnosis present

## 2019-11-19 DIAGNOSIS — M4726 Other spondylosis with radiculopathy, lumbar region: Secondary | ICD-10-CM | POA: Diagnosis present

## 2019-11-19 DIAGNOSIS — E860 Dehydration: Secondary | ICD-10-CM | POA: Diagnosis present

## 2019-11-19 DIAGNOSIS — Z83438 Family history of other disorder of lipoprotein metabolism and other lipidemia: Secondary | ICD-10-CM | POA: Diagnosis not present

## 2019-11-19 DIAGNOSIS — D72829 Elevated white blood cell count, unspecified: Secondary | ICD-10-CM | POA: Diagnosis not present

## 2019-11-19 DIAGNOSIS — M5116 Intervertebral disc disorders with radiculopathy, lumbar region: Secondary | ICD-10-CM | POA: Diagnosis present

## 2019-11-19 DIAGNOSIS — Z885 Allergy status to narcotic agent status: Secondary | ICD-10-CM | POA: Diagnosis not present

## 2019-11-19 DIAGNOSIS — Z79890 Hormone replacement therapy (postmenopausal): Secondary | ICD-10-CM | POA: Diagnosis not present

## 2019-11-19 DIAGNOSIS — M5117 Intervertebral disc disorders with radiculopathy, lumbosacral region: Secondary | ICD-10-CM | POA: Diagnosis present

## 2019-11-19 DIAGNOSIS — T380X5A Adverse effect of glucocorticoids and synthetic analogues, initial encounter: Secondary | ICD-10-CM | POA: Diagnosis not present

## 2019-11-19 DIAGNOSIS — Z79899 Other long term (current) drug therapy: Secondary | ICD-10-CM | POA: Diagnosis not present

## 2019-11-19 DIAGNOSIS — M4727 Other spondylosis with radiculopathy, lumbosacral region: Secondary | ICD-10-CM | POA: Diagnosis present

## 2019-11-19 LAB — BASIC METABOLIC PANEL
Anion gap: 5 (ref 5–15)
BUN: 17 mg/dL (ref 6–20)
CO2: 25 mmol/L (ref 22–32)
Calcium: 8.5 mg/dL — ABNORMAL LOW (ref 8.9–10.3)
Chloride: 109 mmol/L (ref 98–111)
Creatinine, Ser: 1.11 mg/dL (ref 0.61–1.24)
GFR calc Af Amer: >60 mL/min (ref >60)
GFR calc non Af Amer: >60 mL/min (ref >60)
Glucose, Bld: 113 mg/dL — ABNORMAL HIGH (ref 70–99)
Potassium: 3.7 mmol/L (ref 3.5–5.1)
Sodium: 139 mmol/L (ref 135–145)

## 2019-11-19 LAB — CBC WITH DIFFERENTIAL/PLATELET
Abs Immature Granulocytes: 0.14 10*3/uL — ABNORMAL HIGH (ref 0.00–0.07)
Basophils Absolute: 0 10*3/uL (ref 0.0–0.1)
Basophils Relative: 0 %
Eosinophils Absolute: 0.1 10*3/uL (ref 0.0–0.5)
Eosinophils Relative: 0 %
HCT: 42.8 % (ref 39.0–52.0)
Hemoglobin: 15.3 g/dL (ref 13.0–17.0)
Immature Granulocytes: 1 %
Lymphocytes Relative: 14 %
Lymphs Abs: 2.8 10*3/uL (ref 0.7–4.0)
MCH: 32.6 pg (ref 26.0–34.0)
MCHC: 35.7 g/dL (ref 30.0–36.0)
MCV: 91.3 fL (ref 80.0–100.0)
Monocytes Absolute: 1.5 10*3/uL — ABNORMAL HIGH (ref 0.1–1.0)
Monocytes Relative: 8 %
Neutro Abs: 14.8 10*3/uL — ABNORMAL HIGH (ref 1.7–7.7)
Neutrophils Relative %: 77 %
Platelets: 247 10*3/uL (ref 150–400)
RBC: 4.69 MIL/uL (ref 4.22–5.81)
RDW: 13.7 % (ref 11.5–15.5)
WBC: 19.3 10*3/uL — ABNORMAL HIGH (ref 4.0–10.5)
nRBC: 0 % (ref 0.0–0.2)

## 2019-11-19 MED ORDER — TESTOSTERONE CYPIONATE 200 MG/ML IM SOLN
100.0000 mg | INTRAMUSCULAR | Status: DC
  Administered 2019-11-19: 100 mg via INTRAMUSCULAR

## 2019-11-19 MED ORDER — SODIUM CHLORIDE 0.9 % IV SOLN: INTRAVENOUS | Status: DC

## 2019-11-19 NOTE — Consult Note (Addendum)
Referring Physician:  No referring provider defined for this encounter.  Primary Physician:  Danella Penton, MD  Chief Complaint:  Lower back pain and RLE pain  History of Present Illness: 11/19/2019 Mr. Debarr has had ongoing severe pain down his R leg as noted below.  He has required assistance to walk to and from the restroom.  He denies new weakness, and has no symptoms of cauda equina syndrome.  He has been working with therapy, which has been helpful.  11/17/19 from Wynona Canes Zdeb's note: BILLEY WOJCIAK III is a 39 y.o. male who presents with the chief complaint of LBP and RLE radiculopathy. He reports that he had been going about his normal routine and yesterday, he felt a sharp pain in his lower back with immediate shooting, burning pain down his right posterior thigh, calf, and into his right foot.  He reports that the pain was so intense that he was unable to hold himself up on his right leg.  Since that time, he has been unable to find a position of comfort.  He feels that any movement to his right lower extremity causes increase in his lower back and right lower extremity pain.  He denies any overt weakness, however he is avoiding moving his right leg due to the severe pain that he is experiencing.  He does endorse a similar episode of this in July of last year, he was treated at [facility] with steroid injections which he said did help, and he underwent physical therapy and since that time he had been doing well.  Denies any injury or inciting event, although upon recollection, thinks he may have lifted a heavier object yesterday evening which may have triggered the pain he is experiencing now.  He denies any saddle anesthesia, denies any incontinence or difficulty urinating, and denies any incontinence of stool.  He also endorses some LLE pain but this is minimal in comparison to his RLE pain.  The symptoms are causing a significant impact on the patient's life.   Review of Systems:   A 10 point review of systems is negative, except for the pertinent positives and negatives detailed in the HPI.  Past Medical History: History reviewed. No pertinent past medical history.   Past Surgical History: History reviewed. No pertinent surgical history.  Allergies: Allergies as of 11/17/2019 - Review Complete 11/17/2019  Allergen Reaction Noted  . Morphine and related Nausea And Vomiting 11/17/2019    Medications:  Current Facility-Administered Medications:  .  alum & mag hydroxide-simeth (MAALOX/MYLANTA) 200-200-20 MG/5ML suspension 30 mL, 30 mL, Oral, Q4H PRN, Rodolph Bong, MD, 30 mL at 11/18/19 0533 .  enoxaparin (LOVENOX) injection 40 mg, 40 mg, Subcutaneous, Q24H, Rodolph Bong, MD, 40 mg at 11/18/19 2112 .  hydrALAZINE (APRESOLINE) injection 10 mg, 10 mg, Intravenous, Q6H PRN, Rodolph Bong, MD .  ibuprofen (ADVIL) tablet 800 mg, 800 mg, Oral, TID, Rodolph Bong, MD, 800 mg at 11/18/19 2111 .  ketorolac (TORADOL) 30 MG/ML injection 30 mg, 30 mg, Intravenous, Q6H PRN, Rodolph Bong, MD, 30 mg at 11/19/19 1034 .  magnesium citrate solution 1 Bottle, 1 Bottle, Oral, Once PRN, Rodolph Bong, MD .  methocarbamol (ROBAXIN) 1,000 mg in dextrose 5 % 100 mL IVPB, 1,000 mg, Intravenous, Q6H PRN, Rodolph Bong, MD .  methocarbamol (ROBAXIN) tablet 1,000 mg, 1,000 mg, Oral, Q6H PRN, Rodolph Bong, MD .  ondansetron [facility]) tablet 4 mg, 4 mg, Oral, Q6H PRN **OR** ondansetron (ZOFRAN)  injection 4 mg, 4 mg, Intravenous, Q6H PRN, Rodolph Bong, MD, 4 mg at 11/19/19 1034 .  pantoprazole (PROTONIX) EC tablet 40 mg, 40 mg, Oral, Daily, Rodolph Bong, MD, 40 mg at 11/18/19 0903 .  polyethylene glycol (MIRALAX / GLYCOLAX) packet 17 g, 17 g, Oral, Daily PRN, Rodolph Bong, MD .  predniSONE (DELTASONE) tablet 40 mg, 40 mg, Oral, QAC breakfast, Rodolph Bong, MD, 40 mg at 11/18/19 0903 .  pregabalin (LYRICA) capsule 75 mg, 75 mg,  Oral, BID, Rodolph Bong, MD, 75 mg at 11/18/19 2112 .  rosuvastatin (CRESTOR) tablet 20 mg, 20 mg, Oral, QPM, Rodolph Bong, MD, 20 mg at 11/18/19 1609 .  senna (SENOKOT) tablet 8.6 mg, 1 tablet, Oral, BID, Rodolph Bong, MD, 8.6 mg at 11/18/19 2112 .  sorbitol 70 % solution 30 mL, 30 mL, Oral, Daily PRN, Rodolph Bong, MD   Social History: Social History   Tobacco Use  . Smoking status: Never Smoker  . Smokeless tobacco: Never Used  Vaping Use  . Vaping Use: Never used  Substance Use Topics  . Alcohol use: Not Currently  . Drug use: Never    Family Medical History: Family History  Problem Relation Age of Onset  . Diabetes Mother   . CAD Father   . Hyperlipidemia Father     Physical Examination: Vitals:   11/19/19 0457 11/19/19 0917  BP: (!) 144/76 131/79  Pulse: 67 88  Resp: 16 17  Temp: 98.2 F (36.8 C) 97.9 F (36.6 C)  SpO2: 95% 100%     General: Patient is well developed, well nourished, in moderate to severe pain. Psychiatric: Patient is anxious.   NEUROLOGICAL:  General: In mild distress due to pain. He is very careful with his positioning.   Awake, alert, oriented to person, place, and time.  Pupils equal round and reactive to light.    Tongue protrusion is midline.  There is no pronator drift.  Palpation of spine: TTP over lumbar spine, R hip, and R buttock.    Strength:   Side Iliopsoas Quads Hamstring PF DF EHL  R 5 5 5 5 5 5   L 5 5 5 5 5 5    Reflexes are 1+ and symmetric at patella and achilles.   Bilateral upper and lower extremity sensation is intact to light touch, though he says his R S1 distribution does not have normal sensation.  Clonus is not present.    Gait deferred due to pain.    Imaging: Narrative & Impression  CLINICAL DATA:  Progressive low back pain radiating to the left leg.  EXAM: MRI LUMBAR SPINE WITHOUT CONTRAST  TECHNIQUE: Multiplanar, multisequence MR imaging of the lumbar spine  was performed. No intravenous contrast was administered.  COMPARISON:  Radiography 11/17/2019  FINDINGS: Segmentation:  5 lumbar type vertebral bodies.  Alignment:  Normal  Vertebrae:  Normal  Conus medullaris and cauda equina: Conus extends to the L1-2 level. Conus and cauda equina appear normal.  Paraspinal and other soft tissues: Normal  Disc levels:  No abnormality at L2-3 or above.  L3-4: Mild desiccation of the disc. Annular bulging with a central annular fissure. No HNP. No compressive stenosis.  L4-5: Disc degeneration with annular bulging and annular fissures. Slight indentation of the thecal sac but no compressive stenosis.  L5-S1: Right paracentral disc herniation with displacement of the right S1 nerve. No left-sided compressive pathology.  IMPRESSION: 1. Right paracentral disc herniation at L5-S1 with displacement of  the right S1 nerve. This could be a cause of right-sided symptoms. 2. L3-4 and L4-5 disc degeneration with annular bulging and annular fissures. No compressive stenosis at those levels. Findings could relate to back pain or neural irritation. 3. No discrete cause of left radicular symptoms is identified.      Assessment and Plan: Mr. Goshorn is a pleasant 39 y.o. 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   He has had continued symptoms over the past 2 days.  He feels he is not improving, nd he is still having severe pain.  At this point, he may need to have a discectomy for pain relief, as he can not successfully bear weight and likely has some relative weakness on plantar flexion.    We discussed the option of discectomy versus continued PT, OT, and pain control.  He elected to watch for another day or two and see if he will start to improve in that time.  I discussed his case with Dr. Janee Morn, who has Mr. Keim on an appropriate regimen of steroids, muscle relaxant, and pain medication.    We will continue to  follow.  If Mr. Vasil has worsening findings, we will plan for R L5/S1 microdiscectomy.  I spent 40 minutes with the patient and his wife, including discussion of care with Dr. Janee Morn.  Venetia Night MD    Addendum at 1600 on 11/19/2019 Mr. [place]ac[place] attempted to ambulate, and showed evidence of weakness in his R leg, specifically with plantar flexion. Due to new development of RLE weakness and difficulty walking without substantial help, surgical intervention is indicated to decrease the chances that his weakness will be persistent.  Because of the presence of weakness, surgical intervention is indicated on an emergency basis.  We are within the window during which early intervention will improve his expected outcome.    I discussed the planned procedure at length with the patient, including the risks, benefits, alternatives, and indications. The risks discussed include but are not limited to bleeding, infection, need for reoperation, spinal fluid leak, stroke, vision loss, anesthetic complication, coma, paralysis, and even death. I also described in detail that improvement was not guaranteed.  The patient expressed understanding of these risks, and asked that we proceed with surgery. I described the surgery in layman's terms, and gave ample opportunity for questions, which were answered to the best of my ability.   Venetia Night MD

## 2019-11-19 NOTE — Progress Notes (Signed)
PROGRESS NOTE    Jason Conner  KYH:[id number] DOB: Dec 05, 1980 DOA: 11/17/2019 PCP: Danella Penton, MD    Chief Complaint  Patient presents with  . Back Pain    Brief Narrative:  Patient is a pleasant 39 year old gentleman, history of lumbar disc herniation, history of right frontal craniotomy, hyperlipidemia presenting with sudden onset low back pain with radiation to the right lower extremity.  MRI L-spine consistent with L5-S1 disc herniation.  Patient admitted for pain management.  Patient seen in consultation by neurosurgery who are recommending conservative treatment at this time with outpatient follow-up.   Assessment & Plan:   Principal Problem:   Lumbosacral disc herniation Active Problems:   Radicular pain of lower extremity   Hyperlipidemia   Dehydration  1 right paracentral disc herniation at L5-S1 with displacement of right S1 nerve/radicular pain Patient presented with lower back pain with radiation to the right leg/radicular symptoms.  Patient with prior history of disc herniation.  Plain films of the lumbar spine unremarkable.  MRI of the L-spine with right paracentral disc herniation at L5-S1 with displacement of the right S1 nerve, L3-4 L4-5 degeneration with annular bulging and annular fissures.  Patient noted to have had prior symptoms of herniation in the past approximately a year ago which was treated with epidural injection and physical therapy.  Patient seen in consultation initially by neurosurgical PA who feels no acute neurosurgical intervention needed and initially recommended conservative management including pain management and Medrol Dosepak.   Patient with ongoing pain with radiation down his right lower extremity which he is feels is not improving.  Patient noted by OT with some weakness in the right leg as patient was dragging it. Continue scheduled ibuprofen 800 mg 3 times daily x3 more days, Lyrica 75 mg twice daily, prednisone taper, IV Toradol as  needed, Robaxin as needed. As patient with ongoing severe pain, with right lower extremity weakness as patient noted to be dragging his right lower extremity per OT neurosurgery was called to reassess patient again today.  Patient seen by neurosurgery and discussion of option of discectomy versus continued PT/OT and pain control done and patient considering discectomy.  Will make patient n.p.o in case patient has decided on discectomy.  Neurosurgery following and appreciate their input and recommendations.  2.  Hyperlipidemia Continue statin.  3.  Dehydration Hydrate with IV fluids.  Patient to be n.p.o. after midnight and as such we will place on gentle hydration for the next 24 hours.    DVT prophylaxis: Lovenox Code Status: Full Family Communication: Updated patient and wife at bedside. Disposition:   Status is: Observation    Dispo: The patient is from: Home              Anticipated d/c is to: Home              Anticipated d/c date is: To be determined.              Patient currently with a right L5-S1 disc herniation currently undergoing pain management, PT evaluation, receiving IV pain medication.  Patient reassessed by neurosurgery and could possibly go for surgery tomorrow.  Currently not medically stable.       Consultants:   Neurosurgery: Patsey Berthold, NP 11/17/2019  Procedures:   Pain films of the L-spine 11/17/2019  MRI of the L-spine 11/17/2019  Antimicrobials:   None   Subjective: Patient laying on his belly.  Still with low back pain/severe pain radiating down his right  lower extremity.  Patient states pain on movement however when laying still is doing okay.  IV Toradol as needed helping with his pain.  Per occupational therapist patient with significant immobility and increased weakness in the right lower extremity to the point he was dragging it.  Noted that patient with significant pain.  Neurosurgery reconsulted and Dr. Marcell Barlow in the room with patient.     Objective: Vitals:   11/19/19 0057 11/19/19 0457 11/19/19 0917 11/19/19 1619  BP: 124/77 (!) 144/76 131/79 122/71  Pulse: 88 67 88 72  Resp: 17 16 17 16   Temp: 98.7 F (37.1 C) 98.2 F (36.8 C) 97.9 F (36.6 C) 98.2 F (36.8 C)  TempSrc: Oral Oral  Oral  SpO2: 96% 95% 100% 100%  Weight:      Height:        Intake/Output Summary (Last 24 hours) at 11/19/2019 1703 Last data filed at 11/19/2019 0742 Gross per 24 hour  Intake 1576.46 ml  Output 850 ml  Net 726.46 ml   Filed Weights   11/17/19 1029 11/18/19 0500  Weight: 104.3 kg 105.7 kg    Examination:  General exam: NAD Respiratory system: Lungs are to auscultation bilaterally.  No wheezes, no crackles, no rhonchi.  Normal respiratory effort.  Cardiovascular system: RRR no murmurs rubs or gallops.  No JVD.  No lower extremity edema. Gastrointestinal system: Abdomen is nontender, nondistended, soft, positive bowel sounds.  No rebound.  No guarding.  Central nervous system: Alert and oriented. No focal neurological deficits. Extremities: Symmetric 5 x 5 power. Skin: No rashes, lesions or ulcers Psychiatry: Judgement and insight appear normal. Mood & affect appropriate.  Musculoskeletal: Lumbar region/paraspinous muscles tenderness to palpation.    Data Reviewed: I have personally reviewed following labs and imaging studies  CBC: Recent Labs  Lab 11/17/19 1748 11/18/19 0651 11/19/19 0512  WBC 12.3* 12.7* 19.3*  NEUTROABS 11.4*  --  14.8*  HGB 17.2* 17.2* 15.3  HCT 48.4 46.7 42.8  MCV 91.0 89.6 91.3  PLT 255 282 247    Basic Metabolic Panel: Recent Labs  Lab 11/17/19 1748 11/17/19 2144 11/18/19 0651 11/19/19 0512  NA 134*  --  133* 139  K 3.8  --  4.5 3.7  CL 99  --  101 109  CO2 23  --  24 25  GLUCOSE 163*  --  166* 113*  BUN 14  --  19 17  CREATININE 1.06  --  1.35* 1.11  CALCIUM 9.1  --  9.2 8.5*  MG  --  2.0  --   --     GFR: Estimated Creatinine Clearance: 115.2 mL/min (by C-G formula  based on SCr of 1.11 mg/dL).  Liver Function Tests: No results for input(s): AST, ALT, ALKPHOS, BILITOT, PROT, ALBUMIN in the last 168 hours.  CBG: No results for input(s): GLUCAP in the last 168 hours.   Recent Results (from the past 240 hour(s))  SARS Coronavirus 2 by RT PCR (hospital order, performed in [facility] hospital lab) Nasopharyngeal Nasopharyngeal Swab     Status: None   Collection Time: 11/17/19  3:50 PM   Specimen: Nasopharyngeal Swab  Result Value Ref Range Status   SARS Coronavirus 2 NEGATIVE NEGATIVE Final    Comment: (NOTE) SARS-CoV-2 target nucleic acids are NOT DETECTED.  The SARS-CoV-2 RNA is generally detectable in upper and lower respiratory specimens during the acute phase of infection. The lowest concentration of SARS-CoV-2 viral copies this assay can detect is 250 copies / mL.  A negative result does not preclude SARS-CoV-2 infection and should not be used as the sole basis for treatment or other patient management decisions.  A negative result may occur with improper specimen collection / handling, submission of specimen other than nasopharyngeal swab, presence of viral mutation(s) within the areas targeted by this assay, and inadequate number of viral copies (<250 copies / mL). A negative result must be combined with clinical observations, patient history, and epidemiological information.  Fact Sheet for Patients:   BoilerBrush.com.cy  Fact Sheet for Healthcare Providers: https://pope.com/  This test is not yet approved or  cleared by the [place] FDA and has been authorized for detection and/or diagnosis of SARS-CoV-2 by FDA under an Emergency Use Authorization (EUA).  This EUA will remain in effect (meaning this test can be used) for the duration of the COVID-19 declaration under Section 564(b)(1) of the Act, 21 U.S.C. section 360bbb-3(b)(1), unless the authorization is terminated or revoked  sooner.  Performed at [facility], [address], [place], [place] [address]          Radiology Studies: No results found.      Scheduled Meds: . enoxaparin (LOVENOX) injection  40 mg Subcutaneous Q24H  . ibuprofen  800 mg Oral TID  . pantoprazole  40 mg Oral Daily  . predniSONE  40 mg Oral QAC breakfast  . pregabalin  75 mg Oral BID  . rosuvastatin  20 mg Oral QPM  . senna  1 tablet Oral BID  . testosterone cypionate  100 mg Intramuscular Weekly   Continuous Infusions: . methocarbamol (ROBAXIN) IV       LOS: 0 days    Time spent: 35 minutes    Ramiro Harvest, MD [facility]   To contact the attending provider between 7A-7P or the covering provider during after hours 7P-7A, please log into the web site [email or web address] and access using universal [place] password for that web site. If you do not have the password, please call the hospital operator.  11/19/2019, 5:03 PM
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
## 2019-11-19 NOTE — H&P (View-Only) (Signed)
Referring Physician:  No referring provider defined for this encounter.  Primary Physician:  Danella Penton, MD  Chief Complaint:  Lower back pain and RLE pain  History of Present Illness: 11/19/2019 Jason Conner has had ongoing severe pain down his R leg as noted below.  He has required assistance to walk to and from the restroom.  He denies new weakness, and has no symptoms of cauda equina syndrome.  He has been working with therapy, which has been helpful.  11/17/19 from Jason Conner is a 39 y.o. male who presents with the chief complaint of LBP and RLE radiculopathy. He reports that he had been going about his normal routine and yesterday, he felt a sharp pain in his lower back with immediate shooting, burning pain down his right posterior thigh, calf, and into his right foot.  He reports that the pain was so intense that he was unable to hold himself up on his right leg.  Since that time, he has been unable to find a position of comfort.  He feels that any movement to his right lower extremity causes increase in his lower back and right lower extremity pain.  He denies any overt weakness, however he is avoiding moving his right leg due to the severe pain that he is experiencing.  He does endorse a similar episode of this in July of last year, he was treated at [facility] with steroid injections which he said did help, and he underwent physical therapy and since that time he had been doing well.  Denies any injury or inciting event, although upon recollection, thinks he may have lifted a heavier object yesterday evening which may have triggered the pain he is experiencing now.  He denies any saddle anesthesia, denies any incontinence or difficulty urinating, and denies any incontinence of stool.  He also endorses some LLE pain but this is minimal in comparison to his RLE pain.  The symptoms are causing a significant impact on the patient's life.   Review of Systems:   A 10 point review of systems is negative, except for the pertinent positives and negatives detailed in the HPI.  Past Medical History: History reviewed. No pertinent past medical history.   Past Surgical History: History reviewed. No pertinent surgical history.  Allergies: Allergies as of 11/17/2019 - Review Complete 11/17/2019  Allergen Reaction Noted  . Morphine and related Nausea And Vomiting 11/17/2019    Medications:  Current Facility-Administered Medications:  .  alum & mag hydroxide-simeth (MAALOX/MYLANTA) 200-200-20 MG/5ML suspension 30 mL, 30 mL, Oral, Q4H PRN, Rodolph Bong, MD, 30 mL at 11/18/19 0533 .  enoxaparin (LOVENOX) injection 40 mg, 40 mg, Subcutaneous, Q24H, Rodolph Bong, MD, 40 mg at 11/18/19 2112 .  hydrALAZINE (APRESOLINE) injection 10 mg, 10 mg, Intravenous, Q6H PRN, Rodolph Bong, MD .  ibuprofen (ADVIL) tablet 800 mg, 800 mg, Oral, TID, Rodolph Bong, MD, 800 mg at 11/18/19 2111 .  ketorolac (TORADOL) 30 MG/ML injection 30 mg, 30 mg, Intravenous, Q6H PRN, Rodolph Bong, MD, 30 mg at 11/19/19 1034 .  magnesium citrate solution 1 Bottle, 1 Bottle, Oral, Once PRN, Rodolph Bong, MD .  methocarbamol (ROBAXIN) 1,000 mg in dextrose 5 % 100 mL IVPB, 1,000 mg, Intravenous, Q6H PRN, Rodolph Bong, MD .  methocarbamol (ROBAXIN) tablet 1,000 mg, 1,000 mg, Oral, Q6H PRN, Rodolph Bong, MD .  ondansetron [facility]) tablet 4 mg, 4 mg, Oral, Q6H PRN **OR** ondansetron (ZOFRAN)  injection 4 mg, 4 mg, Intravenous, Q6H PRN, Rodolph Bong, MD, 4 mg at 11/19/19 1034 .  pantoprazole (PROTONIX) EC tablet 40 mg, 40 mg, Oral, Daily, Rodolph Bong, MD, 40 mg at 11/18/19 0903 .  polyethylene glycol (MIRALAX / GLYCOLAX) packet 17 g, 17 g, Oral, Daily PRN, Rodolph Bong, MD .  predniSONE (DELTASONE) tablet 40 mg, 40 mg, Oral, QAC breakfast, Rodolph Bong, MD, 40 mg at 11/18/19 0903 .  pregabalin (LYRICA) capsule 75 mg, 75 mg,  Oral, BID, Rodolph Bong, MD, 75 mg at 11/18/19 2112 .  rosuvastatin (CRESTOR) tablet 20 mg, 20 mg, Oral, QPM, Rodolph Bong, MD, 20 mg at 11/18/19 1609 .  senna (SENOKOT) tablet 8.6 mg, 1 tablet, Oral, BID, Rodolph Bong, MD, 8.6 mg at 11/18/19 2112 .  sorbitol 70 % solution 30 mL, 30 mL, Oral, Daily PRN, Rodolph Bong, MD   Social History: Social History   Tobacco Use  . Smoking status: Never Smoker  . Smokeless tobacco: Never Used  Vaping Use  . Vaping Use: Never used  Substance Use Topics  . Alcohol use: Not Currently  . Drug use: Never    Family Medical History: Family History  Problem Relation Age of Onset  . Diabetes Mother   . CAD Father   . Hyperlipidemia Father     Physical Examination: Vitals:   11/19/19 0457 11/19/19 0917  BP: (!) 144/76 131/79  Pulse: 67 88  Resp: 16 17  Temp: 98.2 F (36.8 C) 97.9 F (36.6 C)  SpO2: 95% 100%     General: Patient is well developed, well nourished, in moderate to severe pain. Psychiatric: Patient is anxious.   NEUROLOGICAL:  General: In mild distress due to pain. He is very careful with his positioning.   Awake, alert, oriented to person, place, and time.  Pupils equal round and reactive to light.    Tongue protrusion is midline.  There is no pronator drift.  Palpation of spine: TTP over lumbar spine, R hip, and R buttock.    Strength:   Side Iliopsoas Quads Hamstring PF DF EHL  R 5 5 5 5 5 5   L 5 5 5 5 5 5    Reflexes are 1+ and symmetric at patella and achilles.   Bilateral upper and lower extremity sensation is intact to light touch, though he says his R S1 distribution does not have normal sensation.  Clonus is not present.    Gait deferred due to pain. Rectal tone intact (chaperone present). Sensation to rectal area and buttocks is intact.    Imaging: Narrative & Impression  CLINICAL DATA:  Progressive low back pain radiating to the left leg.  EXAM: MRI LUMBAR SPINE WITHOUT  CONTRAST  TECHNIQUE: Multiplanar, multisequence MR imaging of the lumbar spine was performed. No intravenous contrast was administered.  COMPARISON:  Radiography 11/17/2019  FINDINGS: Segmentation:  5 lumbar type vertebral bodies.  Alignment:  Normal  Vertebrae:  Normal  Conus medullaris and cauda equina: Conus extends to the L1-2 level. Conus and cauda equina appear normal.  Paraspinal and other soft tissues: Normal  Disc levels:  No abnormality at L2-3 or above.  L3-4: Mild desiccation of the disc. Annular bulging with a central annular fissure. No HNP. No compressive stenosis.  L4-5: Disc degeneration with annular bulging and annular fissures. Slight indentation of the thecal sac but no compressive stenosis.  L5-S1: Right paracentral disc herniation with displacement of the right S1 nerve. No left-sided compressive  pathology.  IMPRESSION: 1. 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 This could be a cause of right-sided symptoms. 2. L3-4 and L4-5 disc degeneration with annular bulging and annular fissures. No compressive stenosis at those levels. Findings could relate to back pain or neural irritation. 3. No discrete cause of left radicular symptoms is identified.      Assessment and Plan: Jason Conner is a pleasant 39 y.o. 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   He has had continued symptoms over the past 2 days.  He feels he is not improving, nd he is still having severe pain.  At this point, he may need to have a discectomy for pain relief, as he can not successfully bear weight and likely has some relative weakness on plantar flexion.    We discussed the option of discectomy versus continued PT, OT, and pain control.  He elected to watch for another day or two and see if he will start to improve in that time.  I discussed his case with Dr. Janee Morn, who has Jason Conner on an appropriate  regimen of steroids, muscle relaxant, and pain medication.    We will continue to follow.  If Jason Conner has worsening findings, we will plan for R L5/S1 microdiscectomy.  I spent 40 minutes with the patient and his wife, including discussion of care with Dr. Janee Morn.  Venetia Night MD
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
## 2019-11-20 ENCOUNTER — Encounter
Admission: EM | Disposition: A | Discharge status: Home / Self Care | Payer: Self-pay | Source: Home / Self Care | Attending: Internal Medicine

## 2019-11-20 ENCOUNTER — Inpatient Hospital Stay: Payer: Worker's Compensation | Admitting: Anesthesiology

## 2019-11-20 ENCOUNTER — Inpatient Hospital Stay: Payer: Worker's Compensation

## 2019-11-20 ENCOUNTER — Encounter: Payer: Self-pay | Admitting: Internal Medicine

## 2019-11-20 HISTORY — PX: LUMBAR LAMINECTOMY/DECOMPRESSION MICRODISCECTOMY: SHX5026

## 2019-11-20 LAB — CBC WITH DIFFERENTIAL/PLATELET
Abs Immature Granulocytes: 0.09 10*3/uL — ABNORMAL HIGH (ref 0.00–0.07)
Basophils Absolute: 0 10*3/uL (ref 0.0–0.1)
Basophils Relative: 0 %
Eosinophils Absolute: 0 10*3/uL (ref 0.0–0.5)
Eosinophils Relative: 0 %
HCT: 44.1 % (ref 39.0–52.0)
Hemoglobin: 15.5 g/dL (ref 13.0–17.0)
Immature Granulocytes: 1 %
Lymphocytes Relative: 22 %
Lymphs Abs: 3.1 10*3/uL (ref 0.7–4.0)
MCH: 32.3 pg (ref 26.0–34.0)
MCHC: 35.1 g/dL (ref 30.0–36.0)
MCV: 91.9 fL (ref 80.0–100.0)
Monocytes Absolute: 1 10*3/uL (ref 0.1–1.0)
Monocytes Relative: 7 %
Neutro Abs: 10.1 10*3/uL — ABNORMAL HIGH (ref 1.7–7.7)
Neutrophils Relative %: 70 %
Platelets: 223 10*3/uL (ref 150–400)
RBC: 4.8 MIL/uL (ref 4.22–5.81)
RDW: 13.7 % (ref 11.5–15.5)
WBC: 14.4 10*3/uL — ABNORMAL HIGH (ref 4.0–10.5)
nRBC: 0 % (ref 0.0–0.2)

## 2019-11-20 LAB — BASIC METABOLIC PANEL
Anion gap: 8 (ref 5–15)
BUN: 15 mg/dL (ref 6–20)
CO2: 25 mmol/L (ref 22–32)
Calcium: 8.8 mg/dL — ABNORMAL LOW (ref 8.9–10.3)
Chloride: 106 mmol/L (ref 98–111)
Creatinine, Ser: 1.17 mg/dL (ref 0.61–1.24)
GFR calc Af Amer: >60 mL/min (ref >60)
GFR calc non Af Amer: >60 mL/min (ref >60)
Glucose, Bld: 104 mg/dL — ABNORMAL HIGH (ref 70–99)
Potassium: 3.7 mmol/L (ref 3.5–5.1)
Sodium: 139 mmol/L (ref 135–145)

## 2019-11-20 LAB — PROTIME-INR
INR: 0.9 (ref 0.8–1.2)
Prothrombin Time: 12.2 seconds (ref 11.4–15.2)

## 2019-11-20 SURGERY — LUMBAR LAMINECTOMY/DECOMPRESSION MICRODISCECTOMY 1 LEVEL
Anesthesia: General

## 2019-11-20 MED ORDER — OXYCODONE HCL 5 MG PO TABS
5.0000 mg | ORAL_TABLET | ORAL | Status: DC | PRN
  Administered 2019-11-20: 5 mg via ORAL
  Filled 2019-11-20: qty 1

## 2019-11-20 MED ORDER — FENTANYL CITRATE (PF) 100 MCG/2ML IJ SOLN
INTRAMUSCULAR | Status: DC | PRN
  Administered 2019-11-20 (×3): 50 ug via INTRAVENOUS

## 2019-11-20 MED ORDER — ROCURONIUM BROMIDE 100 MG/10ML IV SOLN
INTRAVENOUS | Status: DC | PRN
  Administered 2019-11-20: 10 mg via INTRAVENOUS
  Administered 2019-11-20: 20 mg via INTRAVENOUS

## 2019-11-20 MED ORDER — FENTANYL CITRATE (PF) 100 MCG/2ML IJ SOLN: 25.0000 ug | INTRAMUSCULAR | Status: DC | PRN

## 2019-11-20 MED ORDER — BUPIVACAINE HCL 0.5 % IJ SOLN
INTRAMUSCULAR | Status: DC | PRN
  Administered 2019-11-20: 20 mL

## 2019-11-20 MED ORDER — METHYLPREDNISOLONE ACETATE 40 MG/ML IJ SUSP
INTRAMUSCULAR | Status: DC | PRN
  Administered 2019-11-20: 40 mg

## 2019-11-20 MED ORDER — FENTANYL CITRATE (PF) 100 MCG/2ML IJ SOLN
INTRAMUSCULAR | Status: AC
  Filled 2019-11-20: qty 2

## 2019-11-20 MED ORDER — PREDNISONE 20 MG PO TABS
30.0000 mg | ORAL_TABLET | Freq: Every day | ORAL | Status: DC
  Administered 2019-11-21: 30 mg via ORAL
  Filled 2019-11-20: qty 1

## 2019-11-20 MED ORDER — PROMETHAZINE HCL 25 MG/ML IJ SOLN: 6.2500 mg | INTRAMUSCULAR | Status: DC | PRN

## 2019-11-20 MED ORDER — EPHEDRINE SULFATE 50 MG/ML IJ SOLN
INTRAMUSCULAR | Status: DC | PRN
  Administered 2019-11-20: 5 mg via INTRAVENOUS
  Administered 2019-11-20: 10 mg via INTRAVENOUS
  Administered 2019-11-20: 5 mg via INTRAVENOUS
  Administered 2019-11-20: 10 mg via INTRAVENOUS

## 2019-11-20 MED ORDER — SUGAMMADEX SODIUM 200 MG/2ML IV SOLN
INTRAVENOUS | Status: DC | PRN
  Administered 2019-11-20: 200 mg via INTRAVENOUS

## 2019-11-20 MED ORDER — PROPOFOL 10 MG/ML IV BOLUS
INTRAVENOUS | Status: AC
  Filled 2019-11-20: qty 20

## 2019-11-20 MED ORDER — BUPIVACAINE-EPINEPHRINE (PF) 0.5% -1:200000 IJ SOLN
INTRAMUSCULAR | Status: DC | PRN
  Administered 2019-11-20: 10 mL
  Administered 2019-11-20: 3 mL

## 2019-11-20 MED ORDER — LACTATED RINGERS IV SOLN: INTRAVENOUS | Status: DC

## 2019-11-20 MED ORDER — ACETAMINOPHEN 10 MG/ML IV SOLN
INTRAVENOUS | Status: DC | PRN
  Administered 2019-11-20: 1000 mg via INTRAVENOUS

## 2019-11-20 MED ORDER — DEXAMETHASONE SODIUM PHOSPHATE 10 MG/ML IJ SOLN
INTRAMUSCULAR | Status: AC
  Filled 2019-11-20: qty 1

## 2019-11-20 MED ORDER — THROMBIN 5000 UNITS EX SOLR
CUTANEOUS | Status: DC | PRN
  Administered 2019-11-20: 5000 [IU] via TOPICAL

## 2019-11-20 MED ORDER — MIDAZOLAM HCL 5 MG/5ML IJ SOLN
INTRAMUSCULAR | Status: DC | PRN
  Administered 2019-11-20: 2 mg via INTRAVENOUS

## 2019-11-20 MED ORDER — SUCCINYLCHOLINE CHLORIDE 200 MG/10ML IV SOSY
PREFILLED_SYRINGE | INTRAVENOUS | Status: AC
  Filled 2019-11-20: qty 10

## 2019-11-20 MED ORDER — SODIUM CHLORIDE 0.9 % IV SOLN
INTRAVENOUS | Status: DC | PRN
  Administered 2019-11-20: 40 mL

## 2019-11-20 MED ORDER — PROPOFOL 10 MG/ML IV BOLUS
INTRAVENOUS | Status: DC | PRN
  Administered 2019-11-20: 200 mg via INTRAVENOUS
  Administered 2019-11-20: 100 mg via INTRAVENOUS

## 2019-11-20 MED ORDER — SODIUM CHLORIDE 0.9 % IR SOLN
Status: DC | PRN
  Administered 2019-11-20: 1000 mL

## 2019-11-20 MED ORDER — LIDOCAINE HCL (CARDIAC) PF 100 MG/5ML IV SOSY
PREFILLED_SYRINGE | INTRAVENOUS | Status: DC | PRN
  Administered 2019-11-20: 100 mg via INTRAVENOUS

## 2019-11-20 MED ORDER — ACETAMINOPHEN 10 MG/ML IV SOLN
INTRAVENOUS | Status: AC
  Filled 2019-11-20: qty 100

## 2019-11-20 MED ORDER — MIDAZOLAM HCL 2 MG/2ML IJ SOLN
INTRAMUSCULAR | Status: AC
  Filled 2019-11-20: qty 2

## 2019-11-20 MED ORDER — SUCCINYLCHOLINE CHLORIDE 20 MG/ML IJ SOLN
INTRAMUSCULAR | Status: DC | PRN
  Administered 2019-11-20: 120 mg via INTRAVENOUS

## 2019-11-20 MED ORDER — DEXAMETHASONE SODIUM PHOSPHATE 10 MG/ML IJ SOLN
INTRAMUSCULAR | Status: DC | PRN
  Administered 2019-11-20: 10 mg via INTRAVENOUS

## 2019-11-20 MED ORDER — ROCURONIUM BROMIDE 10 MG/ML (PF) SYRINGE
PREFILLED_SYRINGE | INTRAVENOUS | Status: AC
  Filled 2019-11-20: qty 10

## 2019-11-20 MED ORDER — CEFAZOLIN SODIUM-DEXTROSE 2-3 GM-%(50ML) IV SOLR
INTRAVENOUS | Status: DC | PRN
  Administered 2019-11-20: 2 g via INTRAVENOUS

## 2019-11-20 SURGICAL SUPPLY — 57 items
BUR NEURO DRILL SOFT 3.0X3.8M (BURR) ×2 IMPLANT
CANISTER SUCT 1200ML W/VALVE (MISCELLANEOUS) ×4 IMPLANT
CHLORAPREP W/TINT 26 (MISCELLANEOUS) ×4 IMPLANT
CNTNR SPEC 2.5X3XGRAD LEK (MISCELLANEOUS) ×1
CONT SPEC 4OZ STER OR WHT (MISCELLANEOUS) ×1
CONTAINER SPEC 2.5X3XGRAD LEK (MISCELLANEOUS) ×1 IMPLANT
COUNTER NEEDLE 20/40 LG (NEEDLE) ×2 IMPLANT
COVER LIGHT HANDLE STERIS (MISCELLANEOUS) ×4 IMPLANT
COVER WAND RF STERILE (DRAPES) ×2 IMPLANT
CUP MEDICINE 2OZ PLAST GRAD ST (MISCELLANEOUS) ×4 IMPLANT
DERMABOND ADVANCED (GAUZE/BANDAGES/DRESSINGS) ×1
DERMABOND ADVANCED .7 DNX12 (GAUZE/BANDAGES/DRESSINGS) ×1 IMPLANT
DRAPE C-ARM 42X72 X-RAY (DRAPES) ×4 IMPLANT
DRAPE LAPAROTOMY 100X77 ABD (DRAPES) ×2 IMPLANT
DRAPE MICROSCOPE SPINE 48X150 (DRAPES) ×2 IMPLANT
DRAPE SURG 17X11 SM STRL (DRAPES) ×8 IMPLANT
ELECT CAUTERY BLADE TIP 2.5 (TIP) ×2
ELECT EZSTD 165MM 6.5IN (MISCELLANEOUS)
ELECT REM PT RETURN 9FT ADLT (ELECTROSURGICAL) ×2
ELECTRODE CAUTERY BLDE TIP 2.5 (TIP) ×1 IMPLANT
ELECTRODE EZSTD 165MM 6.5IN (MISCELLANEOUS) IMPLANT
ELECTRODE REM PT RTRN 9FT ADLT (ELECTROSURGICAL) ×1 IMPLANT
FRAME EYE SHIELD (PROTECTIVE WEAR) ×4 IMPLANT
GLOVE BIOGEL PI IND STRL 7.0 (GLOVE) ×1 IMPLANT
GLOVE BIOGEL PI INDICATOR 7.0 (GLOVE) ×1
GLOVE SURG SYN 7.0 (GLOVE) ×4 IMPLANT
GLOVE SURG SYN 7.0 PF PI (GLOVE) ×2 IMPLANT
GLOVE SURG SYN 8.5  E (GLOVE) ×3
GLOVE SURG SYN 8.5 E (GLOVE) ×3 IMPLANT
GLOVE SURG SYN 8.5 PF PI (GLOVE) ×3 IMPLANT
GOWN SRG XL LVL 3 NONREINFORCE (GOWNS) ×1 IMPLANT
GOWN STRL NON-REIN TWL XL LVL3 (GOWNS) ×1
GOWN STRL REUS W/ TWL XL LVL3 (GOWN DISPOSABLE) ×1 IMPLANT
GOWN STRL REUS W/TWL XL LVL3 (GOWN DISPOSABLE) ×1
GRADUATE 1200CC STRL 31836 (MISCELLANEOUS) ×2 IMPLANT
KIT SPINAL PRONEVIEW (KITS) ×2 IMPLANT
KNIFE BAYONET SHORT DISCETOMY (MISCELLANEOUS) IMPLANT
MARKER SKIN DUAL TIP RULER LAB (MISCELLANEOUS) ×2 IMPLANT
NDL SAFETY ECLIPSE 18X1.5 (NEEDLE) ×1 IMPLANT
NEEDLE HYPO 18GX1.5 SHARP (NEEDLE) ×1
NEEDLE HYPO 22GX1.5 SAFETY (NEEDLE) ×2 IMPLANT
NS IRRIG 1000ML POUR BTL (IV SOLUTION) ×2 IMPLANT
PACK LAMINECTOMY NEURO (CUSTOM PROCEDURE TRAY) ×2 IMPLANT
PAD ARMBOARD 7.5X6 YLW CONV (MISCELLANEOUS) ×2 IMPLANT
SPOGE SURGIFLO 8M (HEMOSTASIS) ×1
SPONGE SURGIFLO 8M (HEMOSTASIS) ×1 IMPLANT
SUT DVC VLOC 3-0 CL 6 P-12 (SUTURE) ×2 IMPLANT
SUT VIC AB 0 CT1 27 (SUTURE) ×1
SUT VIC AB 0 CT1 27XCR 8 STRN (SUTURE) ×1 IMPLANT
SUT VIC AB 2-0 CT1 18 (SUTURE) ×2 IMPLANT
SYR 10ML LL (SYRINGE) ×2 IMPLANT
SYR 20ML LL LF (SYRINGE) ×2 IMPLANT
SYR 30ML LL (SYRINGE) ×4 IMPLANT
SYR 3ML LL SCALE MARK (SYRINGE) ×2 IMPLANT
TOWEL OR 17X26 4PK STRL BLUE (TOWEL DISPOSABLE) ×6 IMPLANT
TUBE METRX 18MMX5CM (INSTRUMENTS) ×1 IMPLANT
TUBING CONNECTING 10 (TUBING) ×2 IMPLANT

## 2019-11-20 NOTE — Interval H&P Note (Signed)
History and Physical Interval Note:  11/20/2019 12:32 PM  [name]  has presented today for surgery, with the diagnosis of DISC PROTRUSION.  The various methods of treatment have been discussed with the patient and family. After consideration of risks, benefits and other options for treatment, the patient has consented to  Procedure(s): L5-S1 MICRODISCECTOMY 1 LEVEL (N/A) as a surgical intervention.  The patient's history has been reviewed, patient examined, no change in status, stable for surgery.  I have reviewed the patient's chart and labs.  Questions were answered to the patient's satisfaction.    Heart sounds normal no MRG. Chest Clear to Auscultation Bilaterally.   Jason Conner

## 2019-11-20 NOTE — TOC Progression Note (Signed)
Transition of Care [facility]) - Progression Note    Patient Details  Name: Jason Conner MRN: [id number] Date of Birth: October 21, 1980  Transition of Care [facility]) CM/SW Contact  Eilleen Kempf, LCSW Phone Number: 11/20/2019, 1:39 PM  Clinical Narrative:    CSW called in referral to CIR on call Kaylee. She stated the patient will likely not be assessed until Tuesday due to holiday staff numbers. CIR will make contact with TOC once patient is assessed for a bed at [facility]        Expected Discharge Plan and Services  [facility]                                               Social Determinants of Health (SDOH) Interventions    Readmission Risk Interventions No flowsheet data found.

## 2019-11-20 NOTE — Progress Notes (Signed)
PT Cancellation Note  Patient Details Name: Jason Conner MRN: [id number] DOB: 1980/10/23   Cancelled Treatment:    Reason Eval/Treat Not Completed: Other (comment) (Pt reports the plan now is to go to surgery today. He would like to defer PT at this time in preparation for surgery. Will await new orders and resume our services after procedure.)  10:43 AM, 11/20/19 Rosamaria Lints, PT, DPT Physical Therapist - [facility]  [phone] (ASCOM)    [name] 11/20/2019, 10:43 AM

## 2019-11-20 NOTE — Transfer of Care (Signed)
Immediate Anesthesia Transfer of Care Note  Patient: Jason Conner  Procedure(s) Performed: L5-S1 MICRODISCECTOMY 1 LEVEL (N/A )  Patient Location: PACU  Anesthesia Type:General  Level of Consciousness: awake, alert  and oriented  Airway & Oxygen Therapy: Patient Spontanous Breathing and Patient connected to face mask oxygen  Post-op Assessment: Report given to RN and Post -op Vital signs reviewed and stable  Post vital signs: Reviewed and stable  Last Vitals:  Vitals Value Taken Time  BP 136/79 11/20/19 1455  Temp    Pulse 88 11/20/19 1503  Resp 11 11/20/19 1503  SpO2 96 % 11/20/19 1503  Vitals shown include unvalidated device data.  Last Pain:  Vitals:   11/20/19 0851  TempSrc:   PainSc: 4          Complications: No complications documented.

## 2019-11-20 NOTE — Op Note (Signed)
Indications: Jason Conner is a 39 yo male who presented with lumbar radiculopathy.  Due to development of weakness, he was taken to the operating room.  Findings: disc herniation L5-S1 on R  Preoperative Diagnosis: Lumbar radiculopathy Postoperative Diagnosis: same   EBL: 25 ml IVF: 900 ml Drains: none Disposition: Extubated and Stable to PACU Complications: none  No foley catheter was placed.   Preoperative Note:   Risks of surgery discussed include: infection, bleeding, stroke, coma, death, paralysis, CSF leak, nerve/spinal cord injury, numbness, tingling, weakness, complex regional pain syndrome, recurrent stenosis and/or disc herniation, vascular injury, development of instability, neck/back pain, need for further surgery, persistent symptoms, development of deformity, and the risks of anesthesia. The patient understood these risks and agreed to proceed.  Operative Note:   1) right L5/S1 microdiscectomy  The patient was then brought from the preoperative center with intravenous access established.  The patient underwent general anesthesia and endotracheal tube intubation, and was then rotated on the [place] rail top where all pressure points were appropriately padded.  The skin was then thoroughly cleansed.  Perioperative antibiotic prophylaxis was administered.  Sterile prep and drapes were then applied and a timeout was then observed.  C-arm was brought into the field under sterile conditions, and the L5-S1 disc space identified and marked with an incision on the right 1cm lateral to midline.  Once this was complete a 2 cm incision was opened with the use of a #10 blade knife.  The Metrx tubes were sequentially advanced under lateral fluoroscopy until a 18 x 50 mm Metrx tube was placed over the facet and lamina and secured to the bed.    The microscope was then sterilely brought into the field and muscle creep was hemostased with a bipolar and resected with a pituitary rongeur.  A  Bovie extender was then used to expose the spinous process and lamina.  Careful attention was placed to not violate the facet capsule. A 3 mm matchstick drill bit was then used to make a hemi-laminotomy trough until the ligamentum flavum was exposed.  This was extended to the base of the spinous process.  Once this was complete and the underlying ligamentum flavum was visualized this was dissected with an up angle curette and resected with a #2 and #3 mm biting Kerrison.  The laminotomy opening was also expanded in similar fashion and hemostasis was obtained with Surgifoam and a patty as well as bone wax.  The rostral aspect of the caudal level of the lamina was also resected with a #2 biting Kerrison effort to further enhance exposure.  Once the underlying dura was visualized a Penfield 4 was then used to dissect and expose the traversing nerve root.  Once this was identified a nerve root retractor suction was used to mobilize this medially.  The venous plexus was hemostased with Surgifoam and light bipolar use.  A small Penfiedl was then used to make a small annulotomy within the disc space and disc space contents were noted to come through the annulus.    The disc herniation was identified and dissected free using a balltip probe. The pituitary rongeur was used to remove the extruded disc fragments. Once the thecal sac and nerve root were noted to be relaxed and under less tension the ball-tipped feeler was passed along the foramen distally to to ensure no residual compression was noted.    Depo-Medrol was placed along the nerve root.  The area was irrigated. The tube system was then removed under  microscopic visualization and hemostasis was obtained with a bipolar.    The fascial layer was reapproximated with the use of a 0- Vicryl suture.  Subcutaneous tissue layer was reapproximated using 2-0 Vicryl suture.  3-0 monocryl was used on the skin. The skin was then cleansed and Dermabond was used to close the  skin opening.  Patient was then rotated back to the preoperative bed awakened from anesthesia and taken to recovery all counts are correct in this case.   Venetia Night MD

## 2019-11-20 NOTE — Anesthesia Postprocedure Evaluation (Signed)
Anesthesia Post Note  Patient: Jason Conner  Procedure(s) Performed: L5-S1 MICRODISCECTOMY 1 LEVEL (N/A )  Patient location during evaluation: PACU Anesthesia Type: General Level of consciousness: awake and alert Pain management: pain level controlled Vital Signs Assessment: post-procedure vital signs reviewed and stable Respiratory status: spontaneous breathing, nonlabored ventilation, respiratory function stable and patient connected to nasal cannula oxygen Cardiovascular status: blood pressure returned to baseline and stable Postop Assessment: no apparent nausea or vomiting Anesthetic complications: no   No complications documented.   Last Vitals:  Vitals:   11/20/19 1747 11/20/19 1848  BP: (!) 158/91 (!) 150/91  Pulse: 83 (!) 103  Resp: 17 15  Temp: 36.6 C 36.6 C  SpO2:  97%    Last Pain:  Vitals:   11/20/19 1848  TempSrc: Oral  PainSc:                  Lenard Simmer

## 2019-11-20 NOTE — Progress Notes (Signed)
Referring Physician:  No referring provider defined for this encounter.  Primary Physician:  Danella Penton, MD  Chief Complaint:  Lower back pain and RLE pain  History of Present Illness: 11/20/2019 Jason Conner continues to have symptoms of radiculopathy, and has developed increasing weakness.  Due to increasing weakness, surgical intervention is recommended.  11/19/2019 Jason Conner has had ongoing severe pain down his R leg as noted below.  He has required assistance to walk to and from the restroom.  He denies new weakness, and has no symptoms of cauda equina syndrome.  He has been working with therapy, which has been helpful.  11/17/19 from Wynona Canes Zdeb's note: Jason Conner is a 39 y.o. male who presents with the chief complaint of LBP and RLE radiculopathy. He reports that he had been going about his normal routine and yesterday, he felt a sharp pain in his lower back with immediate shooting, burning pain down his right posterior thigh, calf, and into his right foot.  He reports that the pain was so intense that he was unable to hold himself up on his right leg.  Since that time, he has been unable to find a position of comfort.  He feels that any movement to his right lower extremity causes increase in his lower back and right lower extremity pain.  He denies any overt weakness, however he is avoiding moving his right leg due to the severe pain that he is experiencing.  He does endorse a similar episode of this in July of last year, he was treated at [facility] with steroid injections which he said did help, and he underwent physical therapy and since that time he had been doing well.  Denies any injury or inciting event, although upon recollection, thinks he may have lifted a heavier object yesterday evening which may have triggered the pain he is experiencing now.  He denies any saddle anesthesia, denies any incontinence or difficulty urinating, and denies any incontinence of  stool.  He also endorses some LLE pain but this is minimal in comparison to his RLE pain.  The symptoms are causing a significant impact on the patient's life.   Review of Systems:  A 10 point review of systems is negative, except for the pertinent positives and negatives detailed in the HPI.  Past Medical History: History reviewed. No pertinent past medical history.   Past Surgical History: History reviewed. No pertinent surgical history.  Allergies:      Allergies as of 11/17/2019 - Review Complete 11/17/2019  Allergen Reaction Noted  . Morphine and related Nausea And Vomiting 11/17/2019    Medications:  Current Facility-Administered Medications:  .  alum & mag hydroxide-simeth (MAALOX/MYLANTA) 200-200-20 MG/5ML suspension 30 mL, 30 mL, Oral, Q4H PRN, Rodolph Bong, MD, 30 mL at 11/18/19 0533 .  enoxaparin (LOVENOX) injection 40 mg, 40 mg, Subcutaneous, Q24H, Rodolph Bong, MD, 40 mg at 11/18/19 2112 .  hydrALAZINE (APRESOLINE) injection 10 mg, 10 mg, Intravenous, Q6H PRN, Rodolph Bong, MD .  ibuprofen (ADVIL) tablet 800 mg, 800 mg, Oral, TID, Rodolph Bong, MD, 800 mg at 11/18/19 2111 .  ketorolac (TORADOL) 30 MG/ML injection 30 mg, 30 mg, Intravenous, Q6H PRN, Rodolph Bong, MD, 30 mg at 11/19/19 1034 .  magnesium citrate solution 1 Bottle, 1 Bottle, Oral, Once PRN, Rodolph Bong, MD .  methocarbamol (ROBAXIN) 1,000 mg in dextrose 5 % 100 mL IVPB, 1,000 mg, Intravenous, Q6H PRN, Rodolph Bong, MD .  methocarbamol (ROBAXIN) tablet 1,000 mg, 1,000 mg, Oral, Q6H PRN, Rodolph Bong, MD .  ondansetron [facility]) tablet 4 mg, 4 mg, Oral, Q6H PRN **OR** ondansetron (ZOFRAN) injection 4 mg, 4 mg, Intravenous, Q6H PRN, Rodolph Bong, MD, 4 mg at 11/19/19 1034 .  pantoprazole (PROTONIX) EC tablet 40 mg, 40 mg, Oral, Daily, Rodolph Bong, MD, 40 mg at 11/18/19 0903 .  polyethylene glycol (MIRALAX / GLYCOLAX) packet 17 g, 17 g,  Oral, Daily PRN, Rodolph Bong, MD .  predniSONE (DELTASONE) tablet 40 mg, 40 mg, Oral, QAC breakfast, Rodolph Bong, MD, 40 mg at 11/18/19 0903 .  pregabalin (LYRICA) capsule 75 mg, 75 mg, Oral, BID, Rodolph Bong, MD, 75 mg at 11/18/19 2112 .  rosuvastatin (CRESTOR) tablet 20 mg, 20 mg, Oral, QPM, Rodolph Bong, MD, 20 mg at 11/18/19 1609 .  senna (SENOKOT) tablet 8.6 mg, 1 tablet, Oral, BID, Rodolph Bong, MD, 8.6 mg at 11/18/19 2112 .  sorbitol 70 % solution 30 mL, 30 mL, Oral, Daily PRN, Rodolph Bong, MD   Social History: Social History       Tobacco Use  . Smoking status: Never Smoker  . Smokeless tobacco: Never Used  Vaping Use  . Vaping Use: Never used  Substance Use Topics  . Alcohol use: Not Currently  . Drug use: Never    Family Medical History:      Family History  Problem Relation Age of Onset  . Diabetes Mother   . CAD Father   . Hyperlipidemia Father     Physical Examination:     Vitals:   11/19/19 0457 11/19/19 0917  BP: (!) 144/76 131/79  Pulse: 67 88  Resp: 16 17  Temp: 98.2 F (36.8 C) 97.9 F (36.6 C)  SpO2: 95% 100%     General:          Patient is well developed, well nourished, in moderate to severe pain. Psychiatric:      Patient is anxious.   NEUROLOGICAL:  General: In mild distress due to pain. He is very careful with his positioning.   Awake, alert, oriented to person, place, and time.  Pupils equal round and reactive to light.    Tongue protrusion is midline.  There is no pronator drift.  Palpation of spine: TTP over lumbar spine, R hip, and R buttock.    Strength:   Side Iliopsoas Quads Hamstring PF DF EHL  R 5 5 5 4 5 5   L 5 5 5 5 5 5    Reflexes are 1+ and symmetric at patella and achilles.   Bilateral upper and lower extremity sensation is intact to light touch, though he says his R S1 distribution does not have normal sensation.  Clonus is not present.    Gait deferred due  to pain.   Imaging: Narrative & Impression  CLINICAL DATA: Progressive low back pain radiating to the left leg.  EXAM: MRI LUMBAR SPINE WITHOUT CONTRAST  TECHNIQUE: Multiplanar, multisequence MR imaging of the lumbar spine was performed. No intravenous contrast was administered.  COMPARISON: Radiography 11/17/2019  FINDINGS: Segmentation: 5 lumbar type vertebral bodies.  Alignment: Normal  Vertebrae: Normal  Conus medullaris and cauda equina: Conus extends to the L1-2 level. Conus and cauda equina appear normal.  Paraspinal and other soft tissues: Normal  Disc levels:  No abnormality at L2-3 or above.  L3-4: Mild desiccation of the disc. Annular bulging with a central annular fissure. No HNP. No compressive  stenosis.  L4-5: Disc degeneration with annular bulging and annular fissures. Slight indentation of the thecal sac but no compressive stenosis.  L5-S1: Right paracentral disc herniation with displacement of the right S1 nerve. No left-sided compressive pathology.  IMPRESSION: 1. 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 This could be a cause of right-sided symptoms. 2. L3-4 and L4-5 disc degeneration with annular bulging and annular fissures. No compressive stenosis at those levels. Findings could relate to back pain or neural irritation. 3. No discrete cause of left radicular symptoms is identified.      Assessment and Plan: Jason Conner is a pleasant 39 y.o. 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   His symptoms have worsened since I saw him yesterday, including development of objective weakness.    I recommended proceeding with surgical intervention.   Venetia Night MD

## 2019-11-20 NOTE — Anesthesia Procedure Notes (Signed)
Procedure Name: Intubation Date/Time: 11/20/2019 1:20 PM Performed by: Henrietta Hoover, CRNA Pre-anesthesia Checklist: Patient identified, Patient being monitored, Timeout performed, Emergency Drugs available and Suction available Patient Re-evaluated:Patient Re-evaluated prior to induction Oxygen Delivery Method: Circle system utilized Preoxygenation: Pre-oxygenation with 100% oxygen Induction Type: IV induction Ventilation: Mask ventilation without difficulty Laryngoscope Size: McGraph and 4 Grade View: Grade I Tube type: Oral Tube size: 7.5 mm Number of attempts: 1 Airway Equipment and Method: Stylet Placement Confirmation: ETT inserted through vocal cords under direct vision,  positive ETCO2 and breath sounds checked- equal and bilateral Secured at: 21 cm Tube secured with: Tape Dental Injury: Teeth and Oropharynx as per pre-operative assessment

## 2019-11-20 NOTE — Anesthesia Preprocedure Evaluation (Signed)
Anesthesia Evaluation  Patient identified by MRN, date of birth, ID band Patient awake    Reviewed: Allergy & Precautions, H&P , NPO status , Patient's Chart, lab work & pertinent test results, reviewed documented beta blocker date and time   History of Anesthesia Complications Negative for: history of anesthetic complications  Airway Mallampati: I  TM Distance: >3 FB Neck ROM: full    Dental  (+) Caps, Dental Advidsory Given, Teeth Intact   Pulmonary neg pulmonary ROS,    Pulmonary exam normal breath sounds clear to auscultation       Cardiovascular Exercise Tolerance: Good (-) hypertension(-) angina(-) Past MI and (-) Cardiac Stents Normal cardiovascular exam(-) dysrhythmias + Valvular Problems/Murmurs (as a child)  Rhythm:regular Rate:Normal     Neuro/Psych neg 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  No problems since then. negative psych ROS   GI/Hepatic Neg liver ROS, GERD  ,  Endo/Other  negative endocrine ROS  Renal/GU negative Renal ROS  negative genitourinary   Musculoskeletal   Abdominal   Peds  Hematology negative hematology ROS (+)   Anesthesia Other Findings History reviewed. No pertinent past medical history.   Reproductive/Obstetrics negative OB ROS                             Anesthesia Physical Anesthesia Plan  ASA: II  Anesthesia Plan: General   Post-op Pain Management:    Induction: Intravenous  PONV Risk Score and Plan: 2 and Ondansetron, Dexamethasone, Midazolam, Treatment may vary due to age or medical condition and Promethazine  Airway Management Planned: Oral ETT  Additional Equipment:   Intra-op Plan:   Post-operative Plan: Extubation in OR  Informed Consent: I have reviewed the patients History and Physical, chart, labs and discussed the procedure including the risks, benefits and alternatives for the proposed anesthesia with the patient or  authorized representative who has indicated his/her understanding and acceptance.     Dental Advisory Given  Plan Discussed with: Anesthesiologist, CRNA and Surgeon  Anesthesia Plan Comments:         Anesthesia Quick Evaluation
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
## 2019-11-20 NOTE — Progress Notes (Signed)
Inpatient Rehab Admissions:  Inpatient Rehab Consult received.  Note plans for surgical management of L5-S1 disc herniation today.  Will follow for post-op therapy recommendations.   Signed: Estill Dooms, PT, DPT Admissions Coordinator [phone] 11/20/19  10:18 AM

## 2019-11-20 NOTE — Progress Notes (Signed)
OT Cancellation Note  Patient Details Name: Jason Conner MRN: [id number] DOB: 1980-10-29   Cancelled Treatment:    Reason Eval/Treat Not Completed: Patient at procedure or test/ unavailable  Pt leaving floor for sx. Will f/u after. Will likely need new orders. Thank you.  Rejeana Brock, MS, OTR/L ascom [phone] 11/20/19, 11:56 AM

## 2019-11-21 LAB — CBC WITH DIFFERENTIAL/PLATELET
Abs Immature Granulocytes: 0.14 10*3/uL — ABNORMAL HIGH (ref 0.00–0.07)
Basophils Absolute: 0 10*3/uL (ref 0.0–0.1)
Basophils Relative: 0 %
Eosinophils Absolute: 0 10*3/uL (ref 0.0–0.5)
Eosinophils Relative: 0 %
HCT: 41.9 % (ref 39.0–52.0)
Hemoglobin: 15 g/dL (ref 13.0–17.0)
Immature Granulocytes: 1 %
Lymphocytes Relative: 9 %
Lymphs Abs: 1.7 10*3/uL (ref 0.7–4.0)
MCH: 32.6 pg (ref 26.0–34.0)
MCHC: 35.8 g/dL (ref 30.0–36.0)
MCV: 91.1 fL (ref 80.0–100.0)
Monocytes Absolute: 1.3 10*3/uL — ABNORMAL HIGH (ref 0.1–1.0)
Monocytes Relative: 7 %
Neutro Abs: 15.5 10*3/uL — ABNORMAL HIGH (ref 1.7–7.7)
Neutrophils Relative %: 83 %
Platelets: 226 10*3/uL (ref 150–400)
RBC: 4.6 MIL/uL (ref 4.22–5.81)
RDW: 13.2 % (ref 11.5–15.5)
WBC: 18.6 10*3/uL — ABNORMAL HIGH (ref 4.0–10.5)
nRBC: 0 % (ref 0.0–0.2)

## 2019-11-21 LAB — URINALYSIS, COMPLETE (UACMP) WITH MICROSCOPIC
Bacteria, UA: NONE SEEN
Bilirubin Urine: NEGATIVE
Glucose, UA: 150 mg/dL — AB
Hgb urine dipstick: NEGATIVE
Ketones, ur: NEGATIVE mg/dL
Leukocytes,Ua: NEGATIVE
Nitrite: NEGATIVE
Protein, ur: NEGATIVE mg/dL
Specific Gravity, Urine: 1.009 (ref 1.005–1.030)
Squamous Epithelial / HPF: NONE SEEN (ref 0–5)
pH: 5 (ref 5.0–8.0)

## 2019-11-21 LAB — BASIC METABOLIC PANEL
Anion gap: 9 (ref 5–15)
BUN: 16 mg/dL (ref 6–20)
CO2: 23 mmol/L (ref 22–32)
Calcium: 8.8 mg/dL — ABNORMAL LOW (ref 8.9–10.3)
Chloride: 104 mmol/L (ref 98–111)
Creatinine, Ser: 1.01 mg/dL (ref 0.61–1.24)
GFR calc Af Amer: >60 mL/min (ref >60)
GFR calc non Af Amer: >60 mL/min (ref >60)
Glucose, Bld: 149 mg/dL — ABNORMAL HIGH (ref 70–99)
Potassium: 4 mmol/L (ref 3.5–5.1)
Sodium: 136 mmol/L (ref 135–145)

## 2019-11-21 MED ORDER — FUROSEMIDE 10 MG/ML IJ SOLN
20.0000 mg | Freq: Once | INTRAMUSCULAR | Status: AC
  Administered 2019-11-21: 20 mg via INTRAVENOUS
  Filled 2019-11-21: qty 4

## 2019-11-21 MED ORDER — SENNA 8.6 MG PO TABS: 1.0000 | ORAL_TABLET | Freq: Two times a day (BID) | ORAL | 0 refills | Status: DC

## 2019-11-21 MED ORDER — METHOCARBAMOL 500 MG PO TABS: 750.0000 mg | ORAL_TABLET | Freq: Four times a day (QID) | ORAL | 0 refills | Status: DC | PRN

## 2019-11-21 MED ORDER — POLYETHYLENE GLYCOL 3350 17 G PO PACK: 17.0000 g | PACK | Freq: Every day | ORAL | 0 refills | Status: DC | PRN

## 2019-11-21 MED ORDER — OXYCODONE HCL 5 MG PO TABS
5.0000 mg | ORAL_TABLET | Freq: Four times a day (QID) | ORAL | 0 refills | Status: AC | PRN
Start: 2019-11-21 — End: 2019-11-26

## 2019-11-21 MED ORDER — PREGABALIN 75 MG PO CAPS: 75.0000 mg | ORAL_CAPSULE | Freq: Two times a day (BID) | ORAL | 0 refills | Status: DC

## 2019-11-21 MED ORDER — ACETAMINOPHEN 500 MG PO TABS: 1000.0000 mg | ORAL_TABLET | Freq: Four times a day (QID) | ORAL | 0 refills | Status: DC

## 2019-11-21 NOTE — Progress Notes (Signed)
Discharge note:  Patient given discharge instructions given and prescriptions given. Patient and wife verbalized understanding. Incision site clean, dry, and intact. IV removed. Patient wheeled to car by staff, transportation provided by wife.  Arlana Hove, RN

## 2019-11-21 NOTE — Progress Notes (Signed)
Inpatient Rehab Admissions Coordinator:   Note therapy recommendations updated to Outpatient PT and no OT f/u.  CIR will sign off at this time.   Estill Dooms, PT, DPT Admissions Coordinator [phone] 11/21/19  12:22 PM
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
## 2019-11-21 NOTE — Discharge Instructions (Signed)
Please hold off on the testosterone until two weeks after surgery. You will follow up in clinic next week with the NP.   Your surgeon has performed an operation on your lumbar spine (low back) to relieve pressure on one or more nerves. Many times, patients feel better immediately after surgery and can "overdo it." Even if you feel well, it is important that you follow these activity guidelines. If you do not let your back heal properly from the surgery, you can increase the chance of a disc herniation and/or return of your symptoms. The following are instructions to help in your recovery once you have been discharged from the hospital.  * Do not take anti-inflammatory medications for 3 days after surgery (naproxen [Aleve], ibuprofen [Advil, Motrin], celecoxib [Celebrex], etc.)  Activity    No bending, lifting, or twisting ("BLT"). Avoid lifting objects heavier than 10 pounds (gallon milk jug).  Where possible, avoid household activities that involve lifting, bending, pushing, or pulling such as laundry, vacuuming, grocery shopping, and childcare. Try to arrange for help from friends and family for these activities while your back heals.  Increase physical activity slowly as tolerated.  Taking short walks is encouraged, but avoid strenuous exercise. Do not jog, run, bicycle, lift weights, or participate in any other exercises unless specifically allowed by your doctor. Avoid prolonged sitting, including car rides.  Talk to your doctor before resuming sexual activity.  You should not drive until cleared by your doctor.  Until released by your doctor, you should not return to work or school.  You should rest at home and let your body heal.   You may shower two days after your surgery.  After showering, lightly dab your incision dry. Do not take a tub bath or go swimming for 3 weeks, or until approved by your doctor at your follow-up appointment.  If you smoke, we strongly recommend that you quit.   Smoking has been proven to interfere with normal healing in your back and will dramatically reduce the success rate of your surgery. Please contact [facility] (800-QUIT-NOW) and use the resources at www.[facility].com for assistance in stopping smoking.  Surgical Incision   If you have a dressing on your incision, you may remove it three days after your surgery. Keep your incision area clean and dry.  If you have staples or stitches on your incision, you should have a follow up scheduled for removal. If you do not have staples or stitches, you will have steri-strips (small pieces of surgical tape) or Dermabond glue. The steri-strips/glue should begin to peel away within about a week (it is fine if the steri-strips fall off before then). If the strips are still in place one week after your surgery, you may gently remove them.  Diet            You may return to your usual diet. Be sure to stay hydrated.  When to Contact us  Although your surgery and recovery will likely be uneventful, you may have some residual numbness, aches, and pains in your back and/or legs. This is normal and should improve in the next few weeks.  However, should you experience any of the following, contact us immediately: . New numbness or weakness . Pain that is progressively getting worse, and is not relieved by your pain medications or rest . Bleeding, redness, swelling, pain, or drainage from surgical incision . Chills or flu-like symptoms . Fever greater than 101.0 F (38.3 C) . Problems with bowel or bladder  functions . Difficulty breathing or shortness of breath . Warmth, tenderness, or swelling in your calf  Contact Information . During office hours (Monday-Friday 9 am to 5 pm), please call your physician at [phone] . After hours and weekends, please call [phone] and an answering service will put you in touch with either Dr. Adriana Simas or Dr. Myer Haff.  . For a life-threatening emergency, call 911

## 2019-11-21 NOTE — Progress Notes (Signed)
Procedure: R L5-S1 microdiscectomy Procedure date: 11/20/2019 Diagnosis: lumbar radiculopathy    History: Jason Conner is s/p R L5-S1 microdiscectomy POD1: Tolerated procedure well, he reports relief of RLE pain and endorses improved strength this am.    Physical Exam: Vitals:   11/21/19 0405 11/21/19 0756  BP: 132/68 132/86  Pulse: 66 64  Resp: 17 16  Temp:  97.6 F (36.4 C)  SpO2: 99% 100%    General: Alert and oriented, lying in bed Strength:5/5 throughout  Sensation: intact and symmetric throughout  Skin: incision to back is clean, dry, intact with dermabond  Data:  Recent Labs  Lab 11/19/19 0512 11/19/19 0512 11/20/19 0421 11/20/19 0421 11/21/19 0433  NA 139   < > 139   < > 136  K 3.7   < > 3.7   < > 4.0  CL 109   < > 106   < > 104  CO2 25   < > 25   < > 23  BUN 17   < > 15   < > 16  CREATININE 1.11   < > 1.17   < > 1.01  GLUCOSE 113*   < > 104*   < > 149*  CALCIUM 8.5*  --  8.8*  --  8.8*   < > = values in this interval not displayed.   No results for input(s): AST, ALT, ALKPHOS in the last 168 hours.  Invalid input(s): TBILI   Recent Labs  Lab 11/19/19 0512 11/19/19 0512 11/20/19 0421 11/20/19 0421 11/21/19 0433  WBC 19.3*   < > 14.4*   < > 18.6*  HGB 15.3   < > 15.5   < > 15.0  HCT 42.8   < > 44.1   < > 41.9  PLT 247  --  223  --  226   < > = values in this interval not displayed.   Recent Labs  Lab 11/18/19 0651 11/20/19 0421  INR 0.9 0.9         Assessment/Plan:  MAKHARI DOVIDIO Conner is POD1 s/p  R L5-S1 microdiscectomy. He appears to be doing well postoperatively and endorses relief of RLE pain.  From a neurosurgical perspective, he is cleared for discharge once he is able to ambulate with PT.   He will follow up in clinic in two weeks with me.  He should remain out of work until his appt with me.     Patsey Berthold, NP Department of Neurosurgery
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
## 2019-11-21 NOTE — Progress Notes (Signed)
OT Cancellation Note  Patient Details Name: Jason Conner MRN: [id number] DOB: 1980-08-03   Cancelled Treatment:    Reason Eval/Treat Not Completed: Other (comment). Pt working with PT upon initial attempt. Will re-attempt at later date/time as pt is available and medically appropriate.   Richrd Prime, MPH, MS, OTR/L ascom [phone] 11/21/19, 9:57 AM

## 2019-11-22 LAB — URINE CULTURE: Culture: NO GROWTH

## 2020-04-11 ENCOUNTER — Encounter: Payer: Self-pay | Admitting: Internal Medicine

## 2020-07-19 DIAGNOSIS — T754XXA Electrocution, initial encounter: Secondary | ICD-10-CM

## 2020-07-19 HISTORY — DX: Electrocution, initial encounter: T75.4XXA

## 2020-08-08 ENCOUNTER — Ambulatory Visit: Payer: No Typology Code available for payment source

## 2020-08-08 ENCOUNTER — Ambulatory Visit: Payer: No Typology Code available for payment source | Admitting: Speech Pathology

## 2020-08-08 ENCOUNTER — Other Ambulatory Visit: Payer: Self-pay

## 2020-08-08 DIAGNOSIS — M6281 Muscle weakness (generalized): Secondary | ICD-10-CM | POA: Insufficient documentation

## 2020-08-08 DIAGNOSIS — R2689 Other abnormalities of gait and mobility: Secondary | ICD-10-CM | POA: Insufficient documentation

## 2020-08-08 DIAGNOSIS — R278 Other lack of coordination: Secondary | ICD-10-CM

## 2020-08-08 DIAGNOSIS — R2681 Unsteadiness on feet: Secondary | ICD-10-CM

## 2020-08-08 DIAGNOSIS — R41841 Cognitive communication deficit: Secondary | ICD-10-CM

## 2020-08-08 DIAGNOSIS — T754XXS Electrocution, sequela: Secondary | ICD-10-CM | POA: Insufficient documentation
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
## 2020-08-13 ENCOUNTER — Encounter: Payer: Self-pay | Admitting: Occupational Therapy

## 2020-08-13 ENCOUNTER — Ambulatory Visit: Payer: No Typology Code available for payment source | Admitting: Occupational Therapy

## 2020-08-13 ENCOUNTER — Ambulatory Visit: Payer: No Typology Code available for payment source | Attending: Internal Medicine

## 2020-08-13 ENCOUNTER — Other Ambulatory Visit: Payer: Self-pay

## 2020-08-13 DIAGNOSIS — R278 Other lack of coordination: Secondary | ICD-10-CM | POA: Insufficient documentation

## 2020-08-13 DIAGNOSIS — M6281 Muscle weakness (generalized): Secondary | ICD-10-CM

## 2020-08-13 DIAGNOSIS — R2681 Unsteadiness on feet: Secondary | ICD-10-CM | POA: Insufficient documentation

## 2020-08-13 DIAGNOSIS — R2689 Other abnormalities of gait and mobility: Secondary | ICD-10-CM

## 2020-08-13 NOTE — Therapy (Deleted)
[facility] MAIN [facility] SERVICES [address] [place], [place], [address] Phone: [phone]   Fax:  [phone]  Physical Therapy Treatment  Patient Details  Name: Jason Conner MRN: [id number] Date of Birth: October 19, 1980 Referring Provider (PT): Bethann Punches, MD   Encounter Date: 08/13/2020    No past medical history on file.  Past Surgical History:  Procedure Laterality Date  . LUMBAR LAMINECTOMY/DECOMPRESSION MICRODISCECTOMY N/A 11/20/2019   Procedure: L5-S1 MICRODISCECTOMY 1 LEVEL;  Surgeon: Venetia Night, MD;  Location: [facility] ORS;  Service: Neurosurgery;  Laterality: N/A;    There were no vitals filed for this visit.  TREATMENT  - 2 min 4 sec, heavy BUE weightbearing on RW, CGA provided as well as WC follow. Pt drags feet B with B LEs remaining in plantarflexed position and abducted position throughout gait cycle. Pt unable to perform dorsiflexion even with cues, likely impacted by increased tone in BLEs. PT educated pt and his spouse on pt pursuing appointment with orthotist/discussed possibility of using AFOs.   BERG: unable to fully complete test today due to time limitations. Will complete next session. STS: 3/4 Standing unsupported 2/4 Sitting with back unsupported but feet supported 4/4 Stand to sit 3/4 Transfers 2/4 Standing unsupported with EC 1/4 Standing unsupported with feet together 1/4 From standing, reaching forward with outstretched hand 0/4 Pick up object from floor 0/4 Standing position, turn and look behind each shoulder 3/4  TUG: deferred to future session d/t time limitations.   ROM assessment of B ankles: LLE AROM lacking -25 deg. From neutral, PROM -15 from neutral RLE AROM lacking -25 deg. From neutral, PROM -16 from neutral  Educated pt on two versions of dorsiflexion stretch: DF belt-assisted stretch in sitting: 1x30 sec BLEs. Educated pt and pt spouse on technique and performing in pain-free  range. DF stretched with LE propped up on chair and/or assisted by another person 1x30 sec each LE. This stretch also includes hamstring stretch. Education pt and spouse on technique and performing within pain-free range.  Educated pt and spouse on how to perform seated hamstring stretch x30 sec BLEs.    Access Code: [email or web address] URL: [email or web address] Date: 08/13/2020 Prepared by: Temple Pacini  Exercises Seated Calf Stretch with Strap - 1 x daily - 7 x weekly - 2 sets - 2 reps - 30 hold Seated Hamstring Stretch with Chair - 1 x daily - 7 x weekly - 2 sets - 2 reps - 30 hold    Pt educated throughout session about proper posture and technique with exercises. Improved exercise technique, movement at target joints, use of target muscles after min to mod verbal, visual, tactile cues.   PT Short Term Goals - 08/09/20 1201      PT SHORT TERM GOAL #1   Title Pt will be independent with HEP in order to improve strength and balance in order to decrease fall risk and improve function at home and in the community.    Baseline 5/26: to be initated    Time 6    Period Weeks    Status New    Target Date 09/19/20             PT Long Term Goals - 08/09/20 1202      PT LONG TERM GOAL #1   Title Pt will complete at least 15 STS on the 30 second STS test in order to improve BLE strength and power and to decrease fall risk.    Baseline  5/26: completes 4, uses BUEs to assist.    Time 12    Period Weeks    Status New    Target Date 10/31/20      PT LONG TERM GOAL #2   Title Pt will improve BERG by at least 3 points in order to demonstrate clinically significant improvement in balance.    Baseline 5/26: will be assessed next session    Time 12    Period Weeks    Status New    Target Date 10/31/20      PT LONG TERM GOAL #3   Title Pt will decrease TUG to below 14 seconds/decrease in order to demonstrate decreased fall risk.    Baseline 5/26: to be assessed future session     Time 12    Period Weeks    Status New    Target Date 10/31/20      PT LONG TERM GOAL #4   Title Pt will improve ABC by at least 13% in order to demonstrate clinically significant improvement in balance confidence.    Baseline 5/26: 6.8%    Time 12    Period Weeks    Status New    Target Date 10/31/20      PT LONG TERM GOAL #5   Title Patient will increase 10 meter walk test to >1.17m/s as to improve gait speed for better community ambulation and to reduce fall risk.    Baseline 5/26: to be assessed future session    Time 12    Period Weeks    Status New    Target Date 10/31/20      Additional Long Term Goals   Additional Long Term Goals Yes      PT LONG TERM GOAL #6   Title Patient will increase FOTO score to equal to or greater than 59 to demonstrate statistically significant improvement in mobility and quality of life.    Baseline 5/26: 34    Time 12    Period Weeks    Status New    Target Date 10/31/20                  Patient will benefit from skilled therapeutic intervention in order to improve the following deficits and impairments:     Visit Diagnosis: No diagnosis found.     Problem List Patient Active Problem List   Diagnosis Date Noted  . Dehydration   . Radicular pain of lower extremity 11/17/2019  . 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 Hyperlipidemia 11/17/2019   Temple Pacini PT, DPT 08/13/2020, 11:23 AM  [facility] MAIN [facility] SERVICES [address] [place], [place], [address] Phone: [phone]   Fax:  [phone]  Name: Jason Conner MRN: [id number] Date of Birth: 12-Sep-1980
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
## 2020-08-14 DIAGNOSIS — Z86711 Personal history of pulmonary embolism: Secondary | ICD-10-CM

## 2020-08-14 HISTORY — DX: Personal history of pulmonary embolism: Z86.711

## 2020-08-15 ENCOUNTER — Ambulatory Visit: Payer: No Typology Code available for payment source | Admitting: Speech Pathology

## 2020-08-15 ENCOUNTER — Other Ambulatory Visit: Payer: Self-pay

## 2020-08-15 ENCOUNTER — Ambulatory Visit: Payer: No Typology Code available for payment source

## 2020-08-15 DIAGNOSIS — M6281 Muscle weakness (generalized): Secondary | ICD-10-CM | POA: Insufficient documentation

## 2020-08-15 DIAGNOSIS — R2689 Other abnormalities of gait and mobility: Secondary | ICD-10-CM

## 2020-08-15 DIAGNOSIS — T754XXS Electrocution, sequela: Secondary | ICD-10-CM | POA: Insufficient documentation

## 2020-08-15 DIAGNOSIS — R278 Other lack of coordination: Secondary | ICD-10-CM | POA: Insufficient documentation

## 2020-08-15 DIAGNOSIS — R262 Difficulty in walking, not elsewhere classified: Secondary | ICD-10-CM | POA: Insufficient documentation

## 2020-08-15 DIAGNOSIS — R2681 Unsteadiness on feet: Secondary | ICD-10-CM | POA: Insufficient documentation

## 2020-08-15 DIAGNOSIS — R41841 Cognitive communication deficit: Secondary | ICD-10-CM | POA: Insufficient documentation

## 2020-08-15 DIAGNOSIS — R269 Unspecified abnormalities of gait and mobility: Secondary | ICD-10-CM | POA: Insufficient documentation
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
## 2020-08-16 ENCOUNTER — Ambulatory Visit: Payer: No Typology Code available for payment source | Admitting: Speech Pathology
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
## 2020-08-16 NOTE — Patient Instructions (Signed)
Create an organized notebook to help with recall of information as well as use as a compensatory memory strategy to write down questions for his doctor's appts

## 2020-08-19 ENCOUNTER — Encounter: Payer: Self-pay | Admitting: Occupational Therapy

## 2020-08-20 ENCOUNTER — Ambulatory Visit: Payer: No Typology Code available for payment source

## 2020-08-20 ENCOUNTER — Ambulatory Visit: Payer: No Typology Code available for payment source | Admitting: Speech Pathology

## 2020-08-20 ENCOUNTER — Encounter: Payer: Self-pay | Admitting: Speech Pathology

## 2020-08-20 ENCOUNTER — Other Ambulatory Visit: Payer: Self-pay

## 2020-08-20 ENCOUNTER — Ambulatory Visit: Payer: No Typology Code available for payment source | Admitting: Occupational Therapy

## 2020-08-20 DIAGNOSIS — R278 Other lack of coordination: Secondary | ICD-10-CM

## 2020-08-20 DIAGNOSIS — R2681 Unsteadiness on feet: Secondary | ICD-10-CM

## 2020-08-20 DIAGNOSIS — R41841 Cognitive communication deficit: Secondary | ICD-10-CM | POA: Diagnosis not present

## 2020-08-20 DIAGNOSIS — M6281 Muscle weakness (generalized): Secondary | ICD-10-CM

## 2020-08-20 DIAGNOSIS — T754XXS Electrocution, sequela: Secondary | ICD-10-CM

## 2020-08-20 DIAGNOSIS — R2689 Other abnormalities of gait and mobility: Secondary | ICD-10-CM
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
## 2020-08-21 ENCOUNTER — Ambulatory Visit: Payer: Self-pay

## 2020-08-21 ENCOUNTER — Encounter: Payer: Self-pay | Admitting: Occupational Therapy

## 2020-08-21 NOTE — Patient Instructions (Signed)
Continue using safe sequencing to decrease impulsivity and increase safety
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
## 2020-08-22 ENCOUNTER — Ambulatory Visit: Payer: No Typology Code available for payment source | Admitting: Occupational Therapy

## 2020-08-22 ENCOUNTER — Ambulatory Visit: Payer: No Typology Code available for payment source | Admitting: Speech Pathology

## 2020-08-22 ENCOUNTER — Ambulatory Visit: Payer: No Typology Code available for payment source

## 2020-08-22 ENCOUNTER — Encounter: Payer: Self-pay | Admitting: Occupational Therapy
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
## 2020-08-27 ENCOUNTER — Ambulatory Visit: Payer: No Typology Code available for payment source | Attending: Internal Medicine | Admitting: Speech Pathology

## 2020-08-27 ENCOUNTER — Other Ambulatory Visit: Payer: Self-pay

## 2020-08-27 ENCOUNTER — Ambulatory Visit: Payer: No Typology Code available for payment source

## 2020-08-27 ENCOUNTER — Ambulatory Visit: Payer: No Typology Code available for payment source | Admitting: Occupational Therapy

## 2020-08-27 DIAGNOSIS — T754XXS Electrocution, sequela: Secondary | ICD-10-CM

## 2020-08-27 DIAGNOSIS — R278 Other lack of coordination: Secondary | ICD-10-CM

## 2020-08-27 DIAGNOSIS — R269 Unspecified abnormalities of gait and mobility: Secondary | ICD-10-CM

## 2020-08-27 DIAGNOSIS — M6281 Muscle weakness (generalized): Secondary | ICD-10-CM

## 2020-08-27 DIAGNOSIS — R41841 Cognitive communication deficit: Secondary | ICD-10-CM | POA: Diagnosis not present

## 2020-08-27 DIAGNOSIS — R2681 Unsteadiness on feet: Secondary | ICD-10-CM

## 2020-08-27 DIAGNOSIS — R262 Difficulty in walking, not elsewhere classified: Secondary | ICD-10-CM
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
## 2020-08-29 ENCOUNTER — Encounter: Payer: Self-pay | Admitting: Occupational Therapy

## 2020-08-29 ENCOUNTER — Ambulatory Visit: Payer: No Typology Code available for payment source

## 2020-08-29 ENCOUNTER — Ambulatory Visit: Payer: No Typology Code available for payment source | Admitting: Speech Pathology

## 2020-08-29 ENCOUNTER — Other Ambulatory Visit: Payer: Self-pay

## 2020-08-29 DIAGNOSIS — M6281 Muscle weakness (generalized): Secondary | ICD-10-CM

## 2020-08-29 DIAGNOSIS — R2689 Other abnormalities of gait and mobility: Secondary | ICD-10-CM

## 2020-08-29 DIAGNOSIS — T754XXS Electrocution, sequela: Secondary | ICD-10-CM

## 2020-08-29 DIAGNOSIS — R41841 Cognitive communication deficit: Secondary | ICD-10-CM

## 2020-08-29 DIAGNOSIS — R269 Unspecified abnormalities of gait and mobility: Secondary | ICD-10-CM

## 2020-08-29 DIAGNOSIS — R278 Other lack of coordination: Secondary | ICD-10-CM

## 2020-08-29 DIAGNOSIS — R262 Difficulty in walking, not elsewhere classified: Secondary | ICD-10-CM

## 2020-08-29 NOTE — Patient Instructions (Signed)
Create list of activities that can be performed from wheelchair level
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
## 2020-08-29 NOTE — Patient Instructions (Signed)
Recall rules of semi-complex game, play with his wife and son
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
## 2020-09-03 ENCOUNTER — Emergency Department: Payer: No Typology Code available for payment source

## 2020-09-03 ENCOUNTER — Other Ambulatory Visit: Payer: Self-pay

## 2020-09-03 ENCOUNTER — Ambulatory Visit: Payer: No Typology Code available for payment source

## 2020-09-03 ENCOUNTER — Encounter: Payer: Self-pay | Admitting: *Deleted

## 2020-09-03 ENCOUNTER — Ambulatory Visit: Payer: No Typology Code available for payment source | Admitting: Speech Pathology

## 2020-09-03 ENCOUNTER — Inpatient Hospital Stay: Payer: No Typology Code available for payment source

## 2020-09-03 ENCOUNTER — Inpatient Hospital Stay
Admission: EM | Admit: 2020-09-03 | Discharge: 2020-09-05 | DRG: 091 | Disposition: A | Discharge status: Other Acute Inpatient Hospital | Payer: No Typology Code available for payment source | Attending: Internal Medicine | Admitting: Internal Medicine

## 2020-09-03 DIAGNOSIS — F4310 Post-traumatic stress disorder, unspecified: Secondary | ICD-10-CM | POA: Diagnosis present

## 2020-09-03 DIAGNOSIS — M62838 Other muscle spasm: Secondary | ICD-10-CM | POA: Diagnosis present

## 2020-09-03 DIAGNOSIS — Z79890 Hormone replacement therapy (postmenopausal): Secondary | ICD-10-CM

## 2020-09-03 DIAGNOSIS — M7989 Other specified soft tissue disorders: Secondary | ICD-10-CM

## 2020-09-03 DIAGNOSIS — Z79899 Other long term (current) drug therapy: Secondary | ICD-10-CM | POA: Diagnosis not present

## 2020-09-03 DIAGNOSIS — M792 Neuralgia and neuritis, unspecified: Secondary | ICD-10-CM | POA: Diagnosis not present

## 2020-09-03 DIAGNOSIS — E872 Acidosis: Secondary | ICD-10-CM | POA: Diagnosis present

## 2020-09-03 DIAGNOSIS — R290 Tetany: Secondary | ICD-10-CM | POA: Diagnosis present

## 2020-09-03 DIAGNOSIS — R7989 Other specified abnormal findings of blood chemistry: Secondary | ICD-10-CM | POA: Diagnosis not present

## 2020-09-03 DIAGNOSIS — N319 Neuromuscular dysfunction of bladder, unspecified: Secondary | ICD-10-CM | POA: Diagnosis present

## 2020-09-03 DIAGNOSIS — G253 Myoclonus: Principal | ICD-10-CM | POA: Diagnosis present

## 2020-09-03 DIAGNOSIS — G629 Polyneuropathy, unspecified: Secondary | ICD-10-CM | POA: Diagnosis present

## 2020-09-03 DIAGNOSIS — G9389 Other specified disorders of brain: Secondary | ICD-10-CM | POA: Diagnosis present

## 2020-09-03 DIAGNOSIS — E785 Hyperlipidemia, unspecified: Secondary | ICD-10-CM | POA: Diagnosis present

## 2020-09-03 DIAGNOSIS — T754XXS Electrocution, sequela: Secondary | ICD-10-CM

## 2020-09-03 DIAGNOSIS — I248 Other forms of acute ischemic heart disease: Secondary | ICD-10-CM | POA: Diagnosis present

## 2020-09-03 DIAGNOSIS — R9401 Abnormal electroencephalogram [EEG]: Secondary | ICD-10-CM | POA: Diagnosis present

## 2020-09-03 DIAGNOSIS — G2582 Stiff-man syndrome: Secondary | ICD-10-CM

## 2020-09-03 DIAGNOSIS — E559 Vitamin D deficiency, unspecified: Secondary | ICD-10-CM | POA: Diagnosis present

## 2020-09-03 DIAGNOSIS — J9601 Acute respiratory failure with hypoxia: Secondary | ICD-10-CM | POA: Diagnosis not present

## 2020-09-03 DIAGNOSIS — M79606 Pain in leg, unspecified: Secondary | ICD-10-CM

## 2020-09-03 DIAGNOSIS — Z8249 Family history of ischemic heart disease and other diseases of the circulatory system: Secondary | ICD-10-CM | POA: Diagnosis not present

## 2020-09-03 DIAGNOSIS — Z87820 Personal history of traumatic brain injury: Secondary | ICD-10-CM

## 2020-09-03 DIAGNOSIS — Z83438 Family history of other disorder of lipoprotein metabolism and other lipidemia: Secondary | ICD-10-CM

## 2020-09-03 DIAGNOSIS — N179 Acute kidney failure, unspecified: Secondary | ICD-10-CM | POA: Diagnosis present

## 2020-09-03 DIAGNOSIS — Z993 Dependence on wheelchair: Secondary | ICD-10-CM

## 2020-09-03 DIAGNOSIS — A419 Sepsis, unspecified organism: Secondary | ICD-10-CM | POA: Diagnosis present

## 2020-09-03 DIAGNOSIS — M79604 Pain in right leg: Secondary | ICD-10-CM

## 2020-09-03 DIAGNOSIS — Z833 Family history of diabetes mellitus: Secondary | ICD-10-CM | POA: Diagnosis not present

## 2020-09-03 DIAGNOSIS — R338 Other retention of urine: Secondary | ICD-10-CM | POA: Diagnosis present

## 2020-09-03 DIAGNOSIS — Z20822 Contact with and (suspected) exposure to covid-19: Secondary | ICD-10-CM | POA: Diagnosis present

## 2020-09-03 DIAGNOSIS — W85XXXS Exposure to electric transmission lines, sequela: Secondary | ICD-10-CM | POA: Diagnosis not present

## 2020-09-03 DIAGNOSIS — R000 Tachycardia, unspecified: Secondary | ICD-10-CM | POA: Diagnosis present

## 2020-09-03 DIAGNOSIS — R0789 Other chest pain: Secondary | ICD-10-CM

## 2020-09-03 DIAGNOSIS — R079 Chest pain, unspecified: Secondary | ICD-10-CM | POA: Diagnosis not present

## 2020-09-03 LAB — COMPREHENSIVE METABOLIC PANEL
ALT: 41 U/L (ref 0–44)
AST: 55 U/L — ABNORMAL HIGH (ref 15–41)
Albumin: 5.1 g/dL — ABNORMAL HIGH (ref 3.5–5.0)
Alkaline Phosphatase: 67 U/L (ref 38–126)
Anion gap: 24 — ABNORMAL HIGH (ref 5–15)
BUN: 10 mg/dL (ref 6–20)
CO2: 15 mmol/L — ABNORMAL LOW (ref 22–32)
Calcium: 9.7 mg/dL (ref 8.9–10.3)
Chloride: 102 mmol/L (ref 98–111)
Creatinine, Ser: 1.53 mg/dL — ABNORMAL HIGH (ref 0.61–1.24)
GFR, Estimated: 58 mL/min — ABNORMAL LOW (ref >60)
Glucose, Bld: 107 mg/dL — ABNORMAL HIGH (ref 70–99)
Potassium: 3.5 mmol/L (ref 3.5–5.1)
Sodium: 141 mmol/L (ref 135–145)
Total Bilirubin: 0.8 mg/dL (ref 0.3–1.2)
Total Protein: 8.9 g/dL — ABNORMAL HIGH (ref 6.5–8.1)

## 2020-09-03 LAB — URINALYSIS, COMPLETE (UACMP) WITH MICROSCOPIC
Bacteria, UA: NONE SEEN
Bilirubin Urine: NEGATIVE
Glucose, UA: NEGATIVE mg/dL
Hgb urine dipstick: NEGATIVE
Ketones, ur: NEGATIVE mg/dL
Leukocytes,Ua: NEGATIVE
Nitrite: NEGATIVE
Protein, ur: NEGATIVE mg/dL
Specific Gravity, Urine: 1.016 (ref 1.005–1.030)
pH: 5 (ref 5.0–8.0)

## 2020-09-03 LAB — CBC WITH DIFFERENTIAL/PLATELET
Abs Immature Granulocytes: 0.12 10*3/uL — ABNORMAL HIGH (ref 0.00–0.07)
Basophils Absolute: 0.1 10*3/uL (ref 0.0–0.1)
Basophils Relative: 1 %
Eosinophils Absolute: 0.3 10*3/uL (ref 0.0–0.5)
Eosinophils Relative: 2 %
HCT: 47.6 % (ref 39.0–52.0)
Hemoglobin: 16.9 g/dL (ref 13.0–17.0)
Immature Granulocytes: 1 %
Lymphocytes Relative: 56 %
Lymphs Abs: 10.1 10*3/uL — ABNORMAL HIGH (ref 0.7–4.0)
MCH: 32.9 pg (ref 26.0–34.0)
MCHC: 35.5 g/dL (ref 30.0–36.0)
MCV: 92.6 fL (ref 80.0–100.0)
Monocytes Absolute: 1.5 10*3/uL — ABNORMAL HIGH (ref 0.1–1.0)
Monocytes Relative: 8 %
Neutro Abs: 5.7 10*3/uL (ref 1.7–7.7)
Neutrophils Relative %: 32 %
Platelets: 308 10*3/uL (ref 150–400)
RBC: 5.14 MIL/uL (ref 4.22–5.81)
RDW: 13.2 % (ref 11.5–15.5)
Smear Review: NORMAL
WBC: 17.8 10*3/uL — ABNORMAL HIGH (ref 4.0–10.5)
nRBC: 0 % (ref 0.0–0.2)

## 2020-09-03 LAB — TROPONIN I (HIGH SENSITIVITY)
Troponin I (High Sensitivity): 11 ng/L (ref <18)
Troponin I (High Sensitivity): 45 ng/L — ABNORMAL HIGH (ref <18)
Troponin I (High Sensitivity): 160 ng/L (ref <18)

## 2020-09-03 LAB — LACTIC ACID, PLASMA
Lactic Acid, Venous: >11 mmol/L (ref 0.5–1.9)
Lactic Acid, Venous: 1.5 mmol/L (ref 0.5–1.9)

## 2020-09-03 LAB — CK: Total CK: 98 U/L (ref 49–397)

## 2020-09-03 LAB — APTT: aPTT: 123 seconds — ABNORMAL HIGH (ref 24–36)

## 2020-09-03 LAB — VITAMIN B12: Vitamin B-12: 1456 pg/mL — ABNORMAL HIGH (ref 180–914)

## 2020-09-03 LAB — PROTIME-INR
INR: 1 (ref 0.8–1.2)
Prothrombin Time: 13.4 seconds (ref 11.4–15.2)

## 2020-09-03 LAB — PHOSPHORUS: Phosphorus: 5.5 mg/dL — ABNORMAL HIGH (ref 2.5–4.6)

## 2020-09-03 LAB — MAGNESIUM: Magnesium: 2.2 mg/dL (ref 1.7–2.4)

## 2020-09-03 MED ORDER — MIDAZOLAM 50MG/50ML (1MG/ML) PREMIX INFUSION
6.0000 mg/h | INTRAVENOUS | Status: DC
  Administered 2020-09-03: 6 mg/h via INTRAVENOUS
  Filled 2020-09-03: qty 50

## 2020-09-03 MED ORDER — LORAZEPAM 2 MG/ML IJ SOLN
INTRAMUSCULAR | Status: AC
  Administered 2020-09-03: 2 mg via INTRAVENOUS
  Filled 2020-09-03: qty 1

## 2020-09-03 MED ORDER — PRAZOSIN HCL 1 MG PO CAPS
1.0000 mg | ORAL_CAPSULE | Freq: Every day | ORAL | Status: DC
  Administered 2020-09-03: 1 mg via ORAL
  Filled 2020-09-03 (×3): qty 1

## 2020-09-03 MED ORDER — GABAPENTIN 300 MG PO CAPS
300.0000 mg | ORAL_CAPSULE | Freq: Every day | ORAL | Status: DC
  Administered 2020-09-03: 300 mg via ORAL
  Filled 2020-09-03 (×2): qty 1

## 2020-09-03 MED ORDER — ALPHA-LIPOIC ACID 600 MG PO TABS: 600.0000 mg | ORAL_TABLET | ORAL | Status: DC

## 2020-09-03 MED ORDER — SODIUM CHLORIDE 0.9 % IV BOLUS
1000.0000 mL | Freq: Once | INTRAVENOUS | Status: AC
  Administered 2020-09-03: 1000 mL via INTRAVENOUS

## 2020-09-03 MED ORDER — LORAZEPAM 2 MG/ML IJ SOLN
2.0000 mg | Freq: Once | INTRAMUSCULAR | Status: AC
  Administered 2020-09-03: 2 mg via INTRAVENOUS

## 2020-09-03 MED ORDER — DULOXETINE HCL 20 MG PO CPEP
40.0000 mg | ORAL_CAPSULE | Freq: Every day | ORAL | Status: DC
  Filled 2020-09-03 (×2): qty 2

## 2020-09-03 MED ORDER — LORAZEPAM 2 MG/ML IJ SOLN
INTRAMUSCULAR | Status: AC
  Administered 2020-09-03: 2 mg
  Filled 2020-09-03: qty 1

## 2020-09-03 MED ORDER — GABAPENTIN 100 MG PO CAPS
100.0000 mg | ORAL_CAPSULE | Freq: Every day | ORAL | Status: DC
  Administered 2020-09-03: 100 mg via ORAL
  Filled 2020-09-03: qty 1

## 2020-09-03 MED ORDER — LORAZEPAM 2 MG/ML IJ SOLN
2.0000 mg | INTRAMUSCULAR | Status: DC | PRN
  Administered 2020-09-04 (×3): 2 mg via INTRAVENOUS
  Filled 2020-09-03 (×4): qty 1

## 2020-09-03 MED ORDER — VITAMIN B-12 1000 MCG PO TABS
3000.0000 ug | ORAL_TABLET | Freq: Every day | ORAL | Status: DC
  Filled 2020-09-03: qty 3

## 2020-09-03 MED ORDER — ROSUVASTATIN CALCIUM 10 MG PO TABS
20.0000 mg | ORAL_TABLET | Freq: Every day | ORAL | Status: DC
  Administered 2020-09-03: 20 mg via ORAL
  Filled 2020-09-03: qty 1
  Filled 2020-09-03: qty 2
  Filled 2020-09-03: qty 1

## 2020-09-03 MED ORDER — ONDANSETRON HCL 4 MG PO TABS: 4.0000 mg | ORAL_TABLET | Freq: Four times a day (QID) | ORAL | Status: DC | PRN

## 2020-09-03 MED ORDER — HYDROMORPHONE HCL 1 MG/ML IJ SOLN
INTRAMUSCULAR | Status: AC
  Filled 2020-09-03: qty 1

## 2020-09-03 MED ORDER — LACTATED RINGERS IV BOLUS
2000.0000 mL | Freq: Once | INTRAVENOUS | Status: AC
  Administered 2020-09-03: 2000 mL via INTRAVENOUS

## 2020-09-03 MED ORDER — ACETAMINOPHEN 325 MG PO TABS: 650.0000 mg | ORAL_TABLET | Freq: Four times a day (QID) | ORAL | Status: DC | PRN

## 2020-09-03 MED ORDER — LACTATED RINGERS IV SOLN: INTRAVENOUS | Status: AC

## 2020-09-03 MED ORDER — MAGNESIUM OXIDE -MG SUPPLEMENT 400 (240 MG) MG PO TABS
400.0000 mg | ORAL_TABLET | Freq: Three times a day (TID) | ORAL | Status: DC
  Administered 2020-09-03: 400 mg via ORAL
  Filled 2020-09-03 (×2): qty 1

## 2020-09-03 MED ORDER — HYDROMORPHONE HCL 1 MG/ML IJ SOLN
1.0000 mg | Freq: Once | INTRAMUSCULAR | Status: AC
  Administered 2020-09-03: 1 mg via INTRAVENOUS

## 2020-09-03 MED ORDER — GABAPENTIN 100 MG PO CAPS
100.0000 mg | ORAL_CAPSULE | Freq: Two times a day (BID) | ORAL | Status: DC
  Administered 2020-09-04: 100 mg via ORAL
  Filled 2020-09-03: qty 1

## 2020-09-03 MED ORDER — MORPHINE SULFATE (PF) 2 MG/ML IV SOLN: 2.0000 mg | INTRAVENOUS | Status: DC | PRN

## 2020-09-03 MED ORDER — MIDAZOLAM HCL 5 MG/5ML IJ SOLN
5.0000 mg | Freq: Once | INTRAMUSCULAR | Status: AC
  Administered 2020-09-03: 5 mg via INTRAVENOUS

## 2020-09-03 MED ORDER — MIDAZOLAM HCL (PF) 5 MG/ML IJ SOLN
4.0000 mg | INTRAMUSCULAR | Status: DC | PRN
  Filled 2020-09-03: qty 1

## 2020-09-03 MED ORDER — LORAZEPAM 2 MG/ML IJ SOLN: 2.0000 mg | Freq: Once | INTRAMUSCULAR | Status: AC

## 2020-09-03 MED ORDER — SODIUM CHLORIDE 0.9 % IV SOLN
2.0000 g | Freq: Three times a day (TID) | INTRAVENOUS | Status: DC
  Administered 2020-09-03 – 2020-09-04 (×2): 2 g via INTRAVENOUS
  Filled 2020-09-03 (×5): qty 2

## 2020-09-03 MED ORDER — CALCIUM ACETATE (PHOS BINDER) 667 MG PO CAPS
667.0000 mg | ORAL_CAPSULE | Freq: Once | ORAL | Status: AC
  Administered 2020-09-03: 667 mg via ORAL
  Filled 2020-09-03: qty 1

## 2020-09-03 MED ORDER — ACETAMINOPHEN 650 MG RE SUPP: 650.0000 mg | Freq: Four times a day (QID) | RECTAL | Status: DC | PRN

## 2020-09-03 MED ORDER — HEPARIN BOLUS VIA INFUSION
4000.0000 [IU] | Freq: Once | INTRAVENOUS | Status: AC
  Administered 2020-09-03: 4000 [IU] via INTRAVENOUS
  Filled 2020-09-03: qty 4000

## 2020-09-03 MED ORDER — LORAZEPAM 2 MG/ML IJ SOLN
INTRAMUSCULAR | Status: AC
  Filled 2020-09-03: qty 1

## 2020-09-03 MED ORDER — HYDROMORPHONE HCL 1 MG/ML IJ SOLN
INTRAMUSCULAR | Status: AC
  Administered 2020-09-03: 1 mg
  Filled 2020-09-03: qty 1

## 2020-09-03 MED ORDER — PANTOPRAZOLE SODIUM 40 MG PO TBEC: 40.0000 mg | DELAYED_RELEASE_TABLET | Freq: Every day | ORAL | Status: DC

## 2020-09-03 MED ORDER — HEPARIN (PORCINE) 25000 UT/250ML-% IV SOLN
1400.0000 [IU]/h | INTRAVENOUS | Status: DC
  Administered 2020-09-03: 1400 [IU]/h via INTRAVENOUS
  Filled 2020-09-03 (×2): qty 250

## 2020-09-03 MED ORDER — MIDAZOLAM HCL 5 MG/5ML IJ SOLN
4.0000 mg | INTRAMUSCULAR | Status: DC | PRN
  Administered 2020-09-04: 4 mg via INTRAVENOUS
  Filled 2020-09-03: qty 5

## 2020-09-03 MED ORDER — VANCOMYCIN HCL 2000 MG/400ML IV SOLN
2000.0000 mg | Freq: Once | INTRAVENOUS | Status: AC
  Administered 2020-09-03: 2000 mg via INTRAVENOUS
  Filled 2020-09-03: qty 400

## 2020-09-03 MED ORDER — CYANOCOBALAMIN 5000 MCG SL SUBL: 5000.0000 ug | SUBLINGUAL_TABLET | Freq: Every day | SUBLINGUAL | Status: DC

## 2020-09-03 MED ORDER — SAW PALMETTO (SERENOA REPENS) 500 MG PO CAPS: 500.0000 mg | ORAL_CAPSULE | ORAL | Status: DC

## 2020-09-03 MED ORDER — IRBESARTAN 150 MG PO TABS
150.0000 mg | ORAL_TABLET | Freq: Every day | ORAL | Status: DC
  Filled 2020-09-03: qty 1

## 2020-09-03 MED ORDER — HYDROMORPHONE HCL 1 MG/ML IJ SOLN
INTRAMUSCULAR | Status: AC
  Administered 2020-09-03: 1 mg via INTRAVENOUS
  Filled 2020-09-03: qty 1

## 2020-09-03 MED ORDER — ENOXAPARIN SODIUM 40 MG/0.4ML IJ SOSY
40.0000 mg | PREFILLED_SYRINGE | INTRAMUSCULAR | Status: DC
  Filled 2020-09-03: qty 0.4

## 2020-09-03 MED ORDER — VITAMIN D (ERGOCALCIFEROL) 1.25 MG (50000 UNIT) PO CAPS
50000.0000 [IU] | ORAL_CAPSULE | Freq: Once | ORAL | Status: AC
  Administered 2020-09-03: 50000 [IU] via ORAL
  Filled 2020-09-03: qty 1

## 2020-09-03 MED ORDER — HYDROMORPHONE HCL 1 MG/ML IJ SOLN
1.0000 mg | INTRAMUSCULAR | Status: AC | PRN
  Administered 2020-09-04: 1 mg via INTRAVENOUS
  Filled 2020-09-03: qty 1

## 2020-09-03 MED ORDER — TIZANIDINE HCL 4 MG PO TABS
4.0000 mg | ORAL_TABLET | Freq: Three times a day (TID) | ORAL | Status: DC
  Administered 2020-09-03: 4 mg via ORAL
  Filled 2020-09-03 (×7): qty 1

## 2020-09-03 MED ORDER — VANCOMYCIN HCL 1250 MG/250ML IV SOLN
1250.0000 mg | Freq: Two times a day (BID) | INTRAVENOUS | Status: DC
  Filled 2020-09-03 (×2): qty 250

## 2020-09-03 MED ORDER — ONDANSETRON HCL 4 MG/2ML IJ SOLN
4.0000 mg | Freq: Four times a day (QID) | INTRAMUSCULAR | Status: DC | PRN
  Administered 2020-09-03: 4 mg via INTRAVENOUS
  Filled 2020-09-03: qty 2

## 2020-09-03 MED ORDER — FENTANYL CITRATE (PF) 100 MCG/2ML IJ SOLN: 25.0000 ug | INTRAMUSCULAR | Status: AC | PRN

## 2020-09-03 NOTE — ED Notes (Signed)
Dr. Katrinka Blazing stopped Versed drip.
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
## 2020-09-03 NOTE — Progress Notes (Signed)
PHARMACIST - PHYSICIAN ORDER COMMUNICATION  CONCERNING: P&T Medication Policy on Herbal Medications  DESCRIPTION:  This patient's order for:  Saw Palmetto  has been noted.  This product(s) is classified as an "herbal" or natural product. Due to a lack of definitive safety studies or FDA approval, nonstandard manufacturing practices, plus the potential risk of unknown drug-drug interactions while on inpatient medications, the Pharmacy and Therapeutics Committee does not permit the use of "herbal" or natural products of this type within [place].   ACTION TAKEN: The pharmacy department is unable to verify this order at this time and your patient has been informed of this safety policy. Please reevaluate patient's clinical condition at discharge and address if the herbal or natural product(s) should be resumed at that time.   

## 2020-09-03 NOTE — ED Notes (Signed)
Dr. Smith at bedside.

## 2020-09-03 NOTE — ED Notes (Signed)
Patient taken to CT scan.
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
## 2020-09-03 NOTE — ED Notes (Signed)
Dr. Fanny Bien aware of Lactic Acid of 11.
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
## 2020-09-03 NOTE — ED Notes (Signed)
Patient arrived alert and oriented, moaning in pain, diaphoretic, and clenching all muscles and rigid and shaking intermittently. Dr. Katrinka Blazing at bedside. Patient c/o 10/10 pain.

## 2020-09-03 NOTE — ED Notes (Signed)
Report given to Sara RN

## 2020-09-03 NOTE — ED Notes (Signed)
[facility] called for potential Neurology transfer, spoke with Tresa Endo

## 2020-09-03 NOTE — ED Notes (Signed)
Patient placed on 3L O2 via [place] for pulse ox at 88% after Versed administration. Dr. Katrinka Blazing aware.

## 2020-09-03 NOTE — Consult Note (Signed)
ANTICOAGULATION CONSULT NOTE - Initial Consult  Pharmacy Consult for heparin infusion Indication: chest pain/ACS  Allergies  Allergen Reactions   Ketamine Other (See Comments)    Hallucinations    Morphine And Related Nausea And Vomiting    Patient Measurements: Height: 6\' 3"  (190.5 cm) Weight: 104.3 kg (230 lb) IBW/kg (Calculated) : 84.5 Heparin Dosing Weight: 104.3 kg  Vital Signs: Temp: 97.7 F (36.5 C) (06/21 1359) Temp Source: Oral (06/21 1359) BP: 132/82 (06/21 1630) Pulse Rate: 102 (06/21 1630)  Labs: Recent Labs    09/03/20 1350 09/03/20 1448 09/03/20 1925  HGB 16.9  --   --   HCT 47.6  --   --   PLT 308  --   --   CREATININE 1.53*  --   --   CKTOTAL 98  --   --   TROPONINIHS 11 45* 160*    Estimated Creatinine Clearance: 83 mL/min (A) (by C-G formula based on SCr of 1.53 mg/dL (H)).   Medical History: History reviewed. No pertinent past medical history.  Medications:  No prior AC noted   Assessment:  40 y.o. male admitted on 09/03/2020 with tachycardia and muscle tetany. Troponin 45 > 160. Pharmacy has been consulted to initiate and manage heparin infusion.   Goal of Therapy:  Heparin level 0.3-0.7 units/ml Monitor platelets by anticoagulation protocol: Yes   Plan:  Give 4000 units bolus x 1 Start heparin infusion at 1400 units/hr Check anti-Xa level in 6 hours and daily while on heparin Continue to monitor H&H and platelets  09/05/2020, PharmD, BCPS Clinical Pharmacist   09/03/2020,9:24 PM
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
## 2020-09-03 NOTE — ED Notes (Signed)
Seizure pads at in place. Ice packs placed under knees per wife's suggestion. Patient is comfortable at this time. Cool cloth used on patient's face to wipe off sweat. Dr. Katrinka Blazing at bedside.
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
## 2020-09-04 ENCOUNTER — Inpatient Hospital Stay: Payer: No Typology Code available for payment source

## 2020-09-04 ENCOUNTER — Ambulatory Visit: Payer: Self-pay

## 2020-09-04 ENCOUNTER — Inpatient Hospital Stay (HOSPITAL_COMMUNITY)
Admit: 2020-09-04 | Discharge: 2020-09-04 | Disposition: A | Discharge status: Home / Self Care | Payer: No Typology Code available for payment source | Attending: Internal Medicine | Admitting: Internal Medicine

## 2020-09-04 ENCOUNTER — Encounter: Payer: Self-pay | Admitting: Internal Medicine

## 2020-09-04 DIAGNOSIS — R079 Chest pain, unspecified: Secondary | ICD-10-CM

## 2020-09-04 DIAGNOSIS — T754XXS Electrocution, sequela: Secondary | ICD-10-CM

## 2020-09-04 DIAGNOSIS — M792 Neuralgia and neuritis, unspecified: Secondary | ICD-10-CM

## 2020-09-04 DIAGNOSIS — R290 Tetany: Secondary | ICD-10-CM

## 2020-09-04 DIAGNOSIS — R7989 Other specified abnormal findings of blood chemistry: Secondary | ICD-10-CM

## 2020-09-04 LAB — BASIC METABOLIC PANEL
Anion gap: 4 — ABNORMAL LOW (ref 5–15)
BUN: 10 mg/dL (ref 6–20)
CO2: 28 mmol/L (ref 22–32)
Calcium: 8.3 mg/dL — ABNORMAL LOW (ref 8.9–10.3)
Chloride: 106 mmol/L (ref 98–111)
Creatinine, Ser: 1.19 mg/dL (ref 0.61–1.24)
GFR, Estimated: >60 mL/min (ref >60)
Glucose, Bld: 91 mg/dL (ref 70–99)
Potassium: 3.9 mmol/L (ref 3.5–5.1)
Sodium: 138 mmol/L (ref 135–145)

## 2020-09-04 LAB — BLOOD GAS, ARTERIAL
Acid-Base Excess: 0.8 mmol/L (ref 0.0–2.0)
Bicarbonate: 24.2 mmol/L (ref 20.0–28.0)
FIO2: 1
MECHVT: 500 mL
O2 Saturation: 100 %
PEEP: 5 cmH2O
Patient temperature: 37
RATE: 16 resp/min
pCO2 arterial: 34 mmHg (ref 32.0–48.0)
pH, Arterial: 7.46 — ABNORMAL HIGH (ref 7.350–7.450)
pO2, Arterial: 422 mmHg — ABNORMAL HIGH (ref 83.0–108.0)

## 2020-09-04 LAB — GLUCOSE, CAPILLARY: Glucose-Capillary: 84 mg/dL (ref 70–99)

## 2020-09-04 LAB — ECHOCARDIOGRAM COMPLETE
AR max vel: 2.44 cm2
AV Area VTI: 2.44 cm2
AV Area mean vel: 2.58 cm2
AV Mean grad: 3 mmHg
AV Peak grad: 6.1 mmHg
Ao pk vel: 1.23 m/s
Area-P 1/2: 4.31 cm2
Height: 75 in
MV VTI: 2.17 cm2
S' Lateral: 3.2 cm
Weight: 3680 oz

## 2020-09-04 LAB — HEPARIN LEVEL (UNFRACTIONATED)
Heparin Unfractionated: 0.45 IU/mL (ref 0.30–0.70)
Heparin Unfractionated: 0.6 IU/mL (ref 0.30–0.70)

## 2020-09-04 LAB — CBC
HCT: 37.7 % — ABNORMAL LOW (ref 39.0–52.0)
Hemoglobin: 13.4 g/dL (ref 13.0–17.0)
MCH: 32.4 pg (ref 26.0–34.0)
MCHC: 35.5 g/dL (ref 30.0–36.0)
MCV: 91.1 fL (ref 80.0–100.0)
Platelets: 199 10*3/uL (ref 150–400)
RBC: 4.14 MIL/uL — ABNORMAL LOW (ref 4.22–5.81)
RDW: 13.6 % (ref 11.5–15.5)
WBC: 10.8 10*3/uL — ABNORMAL HIGH (ref 4.0–10.5)
nRBC: 0 % (ref 0.0–0.2)

## 2020-09-04 LAB — MRSA PCR SCREENING
MRSA by PCR: NEGATIVE
MRSA by PCR: NEGATIVE

## 2020-09-04 LAB — SARS CORONAVIRUS 2 (TAT 6-24 HRS): SARS Coronavirus 2: NEGATIVE

## 2020-09-04 LAB — CK: Total CK: 304 U/L (ref 49–397)

## 2020-09-04 MED ORDER — LACTATED RINGERS IV SOLN: 125.0000 mL | INTRAVENOUS | 0 refills | Status: DC

## 2020-09-04 MED ORDER — CHLORHEXIDINE GLUCONATE CLOTH 2 % EX PADS
6.0000 | MEDICATED_PAD | Freq: Every day | CUTANEOUS | Status: DC
  Administered 2020-09-04: 6 via TOPICAL

## 2020-09-04 MED ORDER — VANCOMYCIN HCL 1500 MG/300ML IV SOLN
1500.0000 mg | Freq: Two times a day (BID) | INTRAVENOUS | Status: DC
  Administered 2020-09-04: 1500 mg via INTRAVENOUS
  Filled 2020-09-04 (×3): qty 300

## 2020-09-04 MED ORDER — IRBESARTAN 150 MG PO TABS: 150.0000 mg | ORAL_TABLET | Freq: Every day | ORAL | Status: DC

## 2020-09-04 MED ORDER — PERFLUTREN LIPID MICROSPHERE
1.0000 mL | INTRAVENOUS | Status: AC | PRN
Start: 2020-09-04 — End: 2020-09-04
  Administered 2020-09-04: 2 mL via INTRAVENOUS
  Filled 2020-09-04: qty 10

## 2020-09-04 MED ORDER — SODIUM CHLORIDE 0.9 % IV SOLN
2.0000 g | Freq: Three times a day (TID) | INTRAVENOUS | Status: DC
  Administered 2020-09-04: 2 g via INTRAVENOUS
  Filled 2020-09-04 (×3): qty 2

## 2020-09-04 MED ORDER — MIDAZOLAM HCL 5 MG/5ML IJ SOLN: 4.0000 mg | INTRAMUSCULAR | Status: DC | PRN

## 2020-09-04 MED ORDER — ONDANSETRON HCL 4 MG PO TABS: 4.0000 mg | ORAL_TABLET | Freq: Four times a day (QID) | ORAL | 0 refills | Status: DC | PRN

## 2020-09-04 MED ORDER — TIZANIDINE HCL 4 MG PO TABS: 4.0000 mg | ORAL_TABLET | Freq: Three times a day (TID) | ORAL | 0 refills | Status: DC

## 2020-09-04 MED ORDER — MIDAZOLAM 50MG/50ML (1MG/ML) PREMIX INFUSION: 0.0000 mg/h | INTRAVENOUS | Status: DC

## 2020-09-04 MED ORDER — DULOXETINE HCL 40 MG PO CPEP: 40.0000 mg | ORAL_CAPSULE | Freq: Every day | ORAL | 3 refills | Status: DC

## 2020-09-04 MED ORDER — PROPOFOL 1000 MG/100ML IV EMUL: 5.0000 ug/kg/min | INTRAVENOUS | Status: DC

## 2020-09-04 MED ORDER — DOCUSATE SODIUM 50 MG/5ML PO LIQD
100.0000 mg | Freq: Two times a day (BID) | ORAL | Status: DC
  Filled 2020-09-04: qty 10

## 2020-09-04 MED ORDER — DOCUSATE SODIUM 50 MG/5ML PO LIQD: 100.0000 mg | Freq: Two times a day (BID) | ORAL | 0 refills | Status: DC

## 2020-09-04 MED ORDER — VECURONIUM BROMIDE 10 MG IV SOLR: 10.0000 mg | Freq: Once | INTRAVENOUS | Status: DC

## 2020-09-04 MED ORDER — FENTANYL 2500MCG IN NS 250ML (10MCG/ML) PREMIX INFUSION
50.0000 ug/h | INTRAVENOUS | Status: DC
Start: 2020-09-04 — End: 2020-09-05
  Administered 2020-09-04: 50 ug/h via INTRAVENOUS
  Filled 2020-09-04: qty 250

## 2020-09-04 MED ORDER — ETOMIDATE 2 MG/ML IV SOLN
20.0000 mg | Freq: Once | INTRAVENOUS | Status: AC
  Administered 2020-09-04: 20 mg via INTRAVENOUS

## 2020-09-04 MED ORDER — POLYETHYLENE GLYCOL 3350 17 G PO PACK: 17.0000 g | PACK | Freq: Every day | ORAL | 0 refills | Status: DC

## 2020-09-04 MED ORDER — GABAPENTIN 300 MG PO CAPS: 300.0000 mg | ORAL_CAPSULE | Freq: Every day | ORAL | Status: DC

## 2020-09-04 MED ORDER — VANCOMYCIN HCL 1500 MG/300ML IV SOLN: 1500.0000 mg | Freq: Two times a day (BID) | INTRAVENOUS | Status: DC

## 2020-09-04 MED ORDER — PRAZOSIN HCL 1 MG PO CAPS: 1.0000 mg | ORAL_CAPSULE | Freq: Every day | ORAL | Status: DC

## 2020-09-04 MED ORDER — FENTANYL BOLUS VIA INFUSION: 50.0000 ug | INTRAVENOUS | 0 refills | Status: DC | PRN

## 2020-09-04 MED ORDER — FENTANYL CITRATE (PF) 100 MCG/2ML IJ SOLN: 50.0000 ug | INTRAMUSCULAR | Status: DC | PRN

## 2020-09-04 MED ORDER — PROPOFOL 1000 MG/100ML IV EMUL
INTRAVENOUS | Status: AC
  Administered 2020-09-04: 5 ug/kg/min via INTRAVENOUS
  Filled 2020-09-04: qty 100

## 2020-09-04 MED ORDER — GABAPENTIN 100 MG PO CAPS: 100.0000 mg | ORAL_CAPSULE | Freq: Three times a day (TID) | ORAL | Status: DC

## 2020-09-04 MED ORDER — SODIUM CHLORIDE 0.9 % IV SOLN: 2.0000 g | Freq: Three times a day (TID) | INTRAVENOUS | Status: DC

## 2020-09-04 MED ORDER — MIDAZOLAM 50MG/50ML (1MG/ML) PREMIX INFUSION
0.0000 mg/h | INTRAVENOUS | Status: DC
  Administered 2020-09-04: 2 mg/h via INTRAVENOUS
  Filled 2020-09-04: qty 50

## 2020-09-04 MED ORDER — LORAZEPAM 2 MG/ML IJ SOLN
2.0000 mg | INTRAMUSCULAR | Status: DC | PRN
  Administered 2020-09-04 (×2): 2 mg via INTRAVENOUS
  Filled 2020-09-04: qty 1

## 2020-09-04 MED ORDER — MIDAZOLAM BOLUS VIA INFUSION: 0.0000 mg | INTRAVENOUS | Status: DC | PRN

## 2020-09-04 MED ORDER — HEPARIN (PORCINE) 25000 UT/250ML-% IV SOLN: 1400.0000 [IU]/h | INTRAVENOUS | Status: DC

## 2020-09-04 MED ORDER — CHLORHEXIDINE GLUCONATE CLOTH 2 % EX PADS: 6.0000 | MEDICATED_PAD | Freq: Every day | CUTANEOUS | Status: DC

## 2020-09-04 MED ORDER — ACETAMINOPHEN 325 MG PO TABS: 650.0000 mg | ORAL_TABLET | Freq: Four times a day (QID) | ORAL | Status: DC | PRN

## 2020-09-04 MED ORDER — FENTANYL BOLUS VIA INFUSION
50.0000 ug | INTRAVENOUS | Status: DC | PRN
Start: 2020-09-04 — End: 2020-09-05
  Filled 2020-09-04: qty 100

## 2020-09-04 MED ORDER — FENTANYL CITRATE (PF) 100 MCG/2ML IJ SOLN
50.0000 ug | Freq: Once | INTRAMUSCULAR | Status: AC
  Administered 2020-09-04: 100 ug via INTRAVENOUS

## 2020-09-04 MED ORDER — POLYETHYLENE GLYCOL 3350 17 G PO PACK: 17.0000 g | PACK | Freq: Every day | ORAL | Status: DC

## 2020-09-04 MED ORDER — FENTANYL 2500MCG IN NS 250ML (10MCG/ML) PREMIX INFUSION: 50.0000 ug/h | INTRAVENOUS | 0 refills | Status: DC

## 2020-09-04 MED ORDER — ROSUVASTATIN CALCIUM 20 MG PO TABS
20.0000 mg | ORAL_TABLET | Freq: Every day | ORAL | Status: DC
Start: 2020-09-04 — End: 2021-04-25

## 2020-09-04 MED ORDER — PROPOFOL 1000 MG/100ML IV EMUL
5.0000 ug/kg/min | INTRAVENOUS | Status: DC
  Administered 2020-09-04 (×2): 35 ug/kg/min via INTRAVENOUS
  Filled 2020-09-04 (×3): qty 100

## 2020-09-04 MED ORDER — FENTANYL CITRATE (PF) 100 MCG/2ML IJ SOLN: 25.0000 ug | INTRAMUSCULAR | 0 refills | Status: DC | PRN

## 2020-09-04 MED ORDER — MAGNESIUM OXIDE -MG SUPPLEMENT 400 (240 MG) MG PO TABS
400.0000 mg | ORAL_TABLET | Freq: Three times a day (TID) | ORAL | Status: DC
Start: 2020-09-04 — End: 2021-04-25

## 2020-09-04 MED ORDER — ROCURONIUM BROMIDE 50 MG/5ML IV SOLN
100.0000 mg | Freq: Once | INTRAVENOUS | Status: AC
  Administered 2020-09-04: 100 mg via INTRAVENOUS
  Filled 2020-09-04: qty 10

## 2020-09-04 MED ORDER — FENTANYL CITRATE (PF) 100 MCG/2ML IJ SOLN: 50.0000 ug | INTRAMUSCULAR | 0 refills | Status: DC | PRN

## 2020-09-04 MED ORDER — PANTOPRAZOLE SODIUM 40 MG PO TBEC: 40.0000 mg | DELAYED_RELEASE_TABLET | Freq: Every day | ORAL | Status: DC

## 2020-09-04 MED ORDER — MIDAZOLAM HCL 2 MG/2ML IJ SOLN
2.0000 mg | Freq: Once | INTRAMUSCULAR | Status: AC
  Administered 2020-09-04: 2 mg via INTRAVENOUS
  Filled 2020-09-04: qty 2

## 2020-09-04 MED ORDER — MIDAZOLAM BOLUS VIA INFUSION
0.0000 mg | INTRAVENOUS | Status: DC | PRN
Start: 2020-09-04 — End: 2020-09-05
  Administered 2020-09-04 (×2): 5 mg via INTRAVENOUS
  Filled 2020-09-04: qty 5

## 2020-09-04 NOTE — ED Notes (Signed)
Pt wife called nurse because pt having another episode of tetany. Bilateral feet and legs rigid. Pt given 2mg  ativan which did not stop tetany. Pt then given 4mg  midazolam. Pt still rigid. Pt now shaking. Wife states that pt legs usually shake after episode of tetany and that at [facility] pt usually gets 12-14mg  ativan to control. Attending contacted to inform and request more ativan.

## 2020-09-04 NOTE — ED Notes (Signed)
Sent msg to Dr Silvestre Gunner to request propofol drip for sedation. Versed has been titrated and pt continues to move arms and grab at tubes. EED tech at bedside also requesting vecuronium for EEG. Communicated requests to Dr Silvestre Gunner.

## 2020-09-04 NOTE — ED Notes (Signed)
Intensivist and attending and intensivist NP at bedside.

## 2020-09-04 NOTE — ED Notes (Signed)
Pt moved up in bed after full body tetany and pt almost coming out of bed. Outpatient PT at bedside (2 people) helping to reposition pt in bed. Dr Myriam Forehand informed of pt HR of 145. He is on phone consulting with ICU doc.

## 2020-09-04 NOTE — ED Notes (Signed)
Dr Myriam Forehand at bedside talking with wife. ICU doctor will be consulted.

## 2020-09-04 NOTE — ED Notes (Signed)
ICU doctor and RT intubating pt. SPO2 100%.
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
## 2020-09-04 NOTE — ED Notes (Signed)
Dr Myriam Forehand still at bedside. Ativan dosage schedule being changed to more frequent. Ativan 2mg  just given again for uncontrolled muscle twitching and tetany. Pt remains on NRB. ICU doc now arriving at bedside. Docs made aware of HR in 140s and 150s.

## 2020-09-04 NOTE — Consult Note (Signed)
ANTICOAGULATION CONSULT NOTE - Initial Consult  Pharmacy Consult for heparin infusion Indication: chest pain/ACS  Allergies  Allergen Reactions   Ketamine Other (See Comments)    Hallucinations    Morphine And Related Nausea And Vomiting    Patient Measurements: Height: 6\' 3"  (190.5 cm) Weight: 104.3 kg (230 lb) IBW/kg (Calculated) : 84.5 Heparin Dosing Weight: 104.3 kg  Vital Signs: BP: 136/104 (06/22 1115) Pulse Rate: 111 (06/22 1115)  Labs: Recent Labs    09/03/20 1350 09/03/20 1448 09/03/20 1925 09/03/20 2220 09/04/20 0358 09/04/20 1000  HGB 16.9  --   --   --  13.4  --   HCT 47.6  --   --   --  37.7*  --   PLT 308  --   --   --  199  --   APTT  --   --   --  123*  --   --   LABPROT  --   --   --  13.4  --   --   INR  --   --   --  1.0  --   --   HEPARINUNFRC  --   --   --   --  0.45 0.60  CREATININE 1.53*  --   --   --  1.19  --   CKTOTAL 98  --   --   --   --   --   TROPONINIHS 11 45* 160*  --   --   --      Estimated Creatinine Clearance: 106.8 mL/min (by C-G formula based on SCr of 1.19 mg/dL).   Medical History: History reviewed. No pertinent past medical history.  Medications:  No prior AC noted   Assessment:  40 y.o. male admitted on 09/03/2020 with tachycardia and muscle tetany. Troponin 45 > 160. Pharmacy has been consulted to initiate and manage heparin infusion.   6/22:  HL @ 0358 = 0.45, therapeutic x 1   Goal of Therapy:  Heparin level 0.3-0.7 units/ml Monitor platelets by anticoagulation protocol: Yes   Plan:  6/22:  HL @ 1000 = 0.60, therapeutic x 2 Will continue pt on current rate and recheck HL with AM labs on 6/23.   7/23, PharmD Clinical Pharmacist   09/04/2020,11:49 AM
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
## 2020-09-04 NOTE — ED Notes (Signed)
Pt sleeping, SPO2 97%.
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
## 2020-09-04 NOTE — ED Notes (Signed)
Pt having another episode of tetany. Wife informed that Dr Myriam Forehand did not change his PRN ativan regimen. Wife stating that hospital is "just letting him suffer through this" and she will "call a trooper to bring him to [facility]" because there they treat him with increased doses as needed.   Dr Myriam Forehand paged to come back to room. Pt also appearing hypoxic on monitor, 68% to 72% with shallow pleth with discernable waveform. Put on NRB at 15L. Still reading 77%.

## 2020-09-04 NOTE — ED Notes (Signed)
Pt resting in bed at this time with eyes closed.  Wife at bedside with pt.  Even chest rise and fall noted.  No distress at this time.

## 2020-09-04 NOTE — Progress Notes (Signed)
   09/04/20 1530  Clinical Encounter Type  Visited With Patient and family together  Visit Type Initial;Spiritual support;Psychological support;Critical Care  Referral From Nurse  Consult/Referral To Chaplain  Spiritual Encounters  Spiritual Needs Prayer   Chaplain Curley Spice was notified by Leward Quan that PT's family could use a visit (Nurse called on pager to request a visit). Chaplain offered Emotional and Spiritual support to PT's family and wife. Chaplain ministered with presence and a time of prayer, which the family wanted. Chaplain will try to follow up.

## 2020-09-04 NOTE — Consult Note (Signed)
ANTICOAGULATION CONSULT NOTE - Initial Consult  Pharmacy Consult for heparin infusion Indication: chest pain/ACS  Allergies  Allergen Reactions   Ketamine Other (See Comments)    Hallucinations    Morphine And Related Nausea And Vomiting    Patient Measurements: Height: 6\' 3"  (190.5 cm) Weight: 104.3 kg (230 lb) IBW/kg (Calculated) : 84.5 Heparin Dosing Weight: 104.3 kg  Vital Signs: BP: 95/58 (06/22 0300) Pulse Rate: 71 (06/22 0300)  Labs: Recent Labs    09/03/20 1350 09/03/20 1448 09/03/20 1925 09/03/20 2220 09/04/20 0358  HGB 16.9  --   --   --  13.4  HCT 47.6  --   --   --  37.7*  PLT 308  --   --   --  199  APTT  --   --   --  123*  --   LABPROT  --   --   --  13.4  --   INR  --   --   --  1.0  --   HEPARINUNFRC  --   --   --   --  0.45  CREATININE 1.53*  --   --   --  1.19  CKTOTAL 98  --   --   --   --   TROPONINIHS 11 45* 160*  --   --      Estimated Creatinine Clearance: 106.8 mL/min (by C-G formula based on SCr of 1.19 mg/dL).   Medical History: History reviewed. No pertinent past medical history.  Medications:  No prior AC noted   Assessment:  40 y.o. male admitted on 09/03/2020 with tachycardia and muscle tetany. Troponin 45 > 160. Pharmacy has been consulted to initiate and manage heparin infusion.   Goal of Therapy:  Heparin level 0.3-0.7 units/ml Monitor platelets by anticoagulation protocol: Yes   Plan:  6/22:  HL @ 0358 = 0.45 Will continue pt on current rate and recheck HL in 6 hrs.   7/22, PharmD Clinical Pharmacist   09/04/2020,4:42 AM

## 2020-09-04 NOTE — Progress Notes (Signed)
Eeg done 
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
## 2020-09-04 NOTE — ED Notes (Signed)
Pt sleeping at this time in bed with wife at bedside. Even chest rise and fall noted.  Pt denies any further needs at this time.  Will continue to monitor.

## 2020-09-04 NOTE — ED Notes (Signed)
EDP at bedside  

## 2020-09-04 NOTE — Progress Notes (Signed)
[facility] arrived to transport patient to [facility]. Fentanyl was given to them for the patient. Patient was transferred to stretcher and the patient is now in the care of [employer]. Wife is updated

## 2020-09-04 NOTE — ED Notes (Signed)
Pt resting in bed with wife at bedside.  Abx hung at this time.  No needs identified at this time.

## 2020-09-04 NOTE — Progress Notes (Signed)
*  PRELIMINARY RESULTS* Echocardiogram 2D Echocardiogram has been performed.  Jason Conner 09/04/2020, 9:09 AM

## 2020-09-04 NOTE — Progress Notes (Signed)
Trooper support 24/7

## 2020-09-04 NOTE — ED Notes (Signed)
Pt eyes closed, legs moving up and down in bed involuntarily. Attending doctor has been secure chatted and paged.

## 2020-09-04 NOTE — ED Notes (Signed)
Dr Myriam Forehand has been at bedside to see pt. PRN ativan and other meds will remain the same.

## 2020-09-04 NOTE — ED Notes (Signed)
Dr Myriam Forehand called and is on his way back to room to see pt and has also consulted neurology.

## 2020-09-04 NOTE — ED Notes (Signed)
Had ED secretary page attending.
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
## 2020-09-04 NOTE — Progress Notes (Signed)
Patient lying in bed. Intubated and tolerating fairly well. Provider went into room to assess patient via sternal rub. Patient became very agitated and was attempting to pull out lines. Family at the bedside. Familiar voice calmed him but he still required PRN bolus x2. VSS. Family remains at the bedside. Patient did relax after the boluses were given. Will continue to monitor.

## 2020-09-04 NOTE — ED Notes (Signed)
RT and intensivist at bedside. Intubation meds being prepared.

## 2020-09-05 ENCOUNTER — Ambulatory Visit: Payer: Self-pay

## 2020-09-05 ENCOUNTER — Ambulatory Visit: Payer: Self-pay | Admitting: Speech Pathology

## 2020-09-05 ENCOUNTER — Encounter: Payer: Self-pay | Admitting: Speech Pathology

## 2020-09-05 DIAGNOSIS — R7989 Other specified abnormal findings of blood chemistry: Secondary | ICD-10-CM

## 2020-09-05 MED ORDER — PRAZOSIN HCL 1 MG PO CAPS: 1.0000 mg | ORAL_CAPSULE | Freq: Every day | ORAL | Status: DC

## 2020-09-05 MED ORDER — IRBESARTAN 150 MG PO TABS: 150.0000 mg | ORAL_TABLET | Freq: Every day | ORAL | Status: DC

## 2020-09-05 MED ORDER — ROSUVASTATIN CALCIUM 10 MG PO TABS: 20.0000 mg | ORAL_TABLET | Freq: Every day | ORAL | Status: DC

## 2020-09-05 MED ORDER — TIZANIDINE HCL 4 MG PO TABS
4.0000 mg | ORAL_TABLET | Freq: Three times a day (TID) | ORAL | 0 refills | Status: DC
Start: 2020-09-05 — End: 2021-04-25

## 2020-09-05 MED ORDER — SODIUM CHLORIDE 0.9 % IV SOLN: 2.0000 g | Freq: Three times a day (TID) | INTRAVENOUS | Status: DC

## 2020-09-05 MED ORDER — ACETAMINOPHEN 160 MG/5ML PO SOLN: 650.0000 mg | Freq: Four times a day (QID) | ORAL | 0 refills | Status: DC | PRN

## 2020-09-05 MED ORDER — PANTOPRAZOLE SODIUM 40 MG PO PACK: 40.0000 mg | PACK | Freq: Every day | ORAL | Status: DC

## 2020-09-05 MED ORDER — MAGNESIUM OXIDE -MG SUPPLEMENT 400 (240 MG) MG PO TABS: 400.0000 mg | ORAL_TABLET | Freq: Three times a day (TID) | ORAL | Status: DC

## 2020-09-05 MED ORDER — GABAPENTIN 300 MG PO CAPS: 300.0000 mg | ORAL_CAPSULE | Freq: Every day | ORAL | Status: DC

## 2020-09-05 MED ORDER — ACETAMINOPHEN 160 MG/5ML PO SOLN
650.0000 mg | Freq: Four times a day (QID) | ORAL | Status: DC | PRN
  Filled 2020-09-05: qty 20.3

## 2020-09-05 MED ORDER — TIZANIDINE HCL 4 MG PO TABS
4.0000 mg | ORAL_TABLET | Freq: Three times a day (TID) | ORAL | Status: DC
  Filled 2020-09-05: qty 1

## 2020-09-05 MED ORDER — ROSUVASTATIN CALCIUM 20 MG PO TABS: 20.0000 mg | ORAL_TABLET | Freq: Every day | ORAL | Status: DC
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
## 2020-09-06 LAB — VITAMIN B1: Vitamin B1 (Thiamine): 147.3 nmol/L (ref 66.5–200.0)

## 2020-09-08 LAB — CULTURE, BLOOD (ROUTINE X 2)
Culture: NO GROWTH
Culture: NO GROWTH
Special Requests: ADEQUATE

## 2020-09-10 ENCOUNTER — Ambulatory Visit: Payer: Self-pay | Admitting: Speech Pathology

## 2020-09-10 ENCOUNTER — Ambulatory Visit: Payer: Self-pay

## 2020-09-12 ENCOUNTER — Ambulatory Visit: Payer: Self-pay

## 2020-09-12 ENCOUNTER — Ambulatory Visit: Payer: Self-pay | Admitting: Speech Pathology

## 2020-09-13 ENCOUNTER — Encounter: Payer: Self-pay | Admitting: Speech Pathology

## 2020-09-17 ENCOUNTER — Ambulatory Visit: Payer: Self-pay

## 2020-09-17 ENCOUNTER — Ambulatory Visit: Payer: Self-pay | Admitting: Speech Pathology

## 2020-09-19 ENCOUNTER — Encounter: Payer: Self-pay | Admitting: Speech Pathology

## 2020-09-24 ENCOUNTER — Ambulatory Visit: Payer: Self-pay

## 2020-09-24 ENCOUNTER — Encounter: Payer: Self-pay | Admitting: Speech Pathology

## 2020-09-26 ENCOUNTER — Ambulatory Visit: Payer: Self-pay

## 2020-09-26 ENCOUNTER — Encounter: Payer: Self-pay | Admitting: Speech Pathology

## 2020-10-01 ENCOUNTER — Ambulatory Visit: Payer: Self-pay

## 2020-10-01 ENCOUNTER — Encounter: Payer: Self-pay | Admitting: Speech Pathology

## 2020-10-03 ENCOUNTER — Encounter: Payer: Self-pay | Admitting: Speech Pathology

## 2020-10-09 ENCOUNTER — Encounter: Payer: Self-pay | Admitting: Occupational Therapy

## 2020-10-09 ENCOUNTER — Ambulatory Visit: Payer: Self-pay

## 2020-10-10 ENCOUNTER — Encounter: Payer: Self-pay | Admitting: Speech Pathology

## 2020-10-10 ENCOUNTER — Encounter: Payer: Self-pay | Admitting: Occupational Therapy

## 2020-10-15 ENCOUNTER — Ambulatory Visit: Payer: Self-pay

## 2020-10-15 ENCOUNTER — Encounter: Payer: Self-pay | Admitting: Speech Pathology

## 2020-10-17 ENCOUNTER — Encounter: Payer: Self-pay | Admitting: Speech Pathology

## 2020-10-21 ENCOUNTER — Encounter: Payer: Self-pay | Admitting: Speech Pathology

## 2020-10-21 ENCOUNTER — Ambulatory Visit: Payer: Self-pay

## 2020-10-24 ENCOUNTER — Encounter: Payer: Self-pay | Admitting: Speech Pathology

## 2020-10-24 ENCOUNTER — Ambulatory Visit: Payer: Self-pay

## 2020-10-28 ENCOUNTER — Encounter: Payer: Self-pay | Admitting: Speech Pathology

## 2020-10-28 ENCOUNTER — Ambulatory Visit: Payer: Self-pay

## 2020-10-31 ENCOUNTER — Ambulatory Visit: Payer: Self-pay

## 2020-10-31 ENCOUNTER — Encounter: Payer: Self-pay | Admitting: Speech Pathology

## 2020-11-01 ENCOUNTER — Other Ambulatory Visit: Payer: Self-pay | Admitting: Neurology

## 2020-11-01 DIAGNOSIS — R569 Unspecified convulsions: Secondary | ICD-10-CM

## 2020-11-05 ENCOUNTER — Encounter: Payer: Self-pay | Admitting: Speech Pathology

## 2020-11-05 ENCOUNTER — Ambulatory Visit: Payer: Self-pay

## 2020-11-07 ENCOUNTER — Ambulatory Visit: Payer: Self-pay

## 2020-11-07 ENCOUNTER — Encounter: Payer: Self-pay | Admitting: Speech Pathology

## 2020-11-12 ENCOUNTER — Encounter: Payer: Self-pay | Admitting: Speech Pathology

## 2020-11-12 ENCOUNTER — Ambulatory Visit: Payer: Self-pay

## 2020-11-14 ENCOUNTER — Encounter: Payer: Self-pay | Admitting: Speech Pathology

## 2020-11-14 ENCOUNTER — Ambulatory Visit: Payer: Self-pay

## 2020-11-14 ENCOUNTER — Other Ambulatory Visit: Payer: Self-pay

## 2020-11-14 ENCOUNTER — Ambulatory Visit
Admission: RE | Admit: 2020-11-14 | Discharge: 2020-11-14 | Disposition: A | Discharge status: Home / Self Care | Payer: No Typology Code available for payment source | Source: Ambulatory Visit | Attending: Neurology | Admitting: Neurology

## 2020-11-14 DIAGNOSIS — R569 Unspecified convulsions: Secondary | ICD-10-CM | POA: Insufficient documentation

## 2020-11-19 ENCOUNTER — Encounter: Payer: Self-pay | Admitting: Speech Pathology

## 2020-11-19 ENCOUNTER — Ambulatory Visit: Payer: Self-pay

## 2020-11-21 ENCOUNTER — Ambulatory Visit: Payer: Self-pay

## 2020-11-21 ENCOUNTER — Encounter: Payer: Self-pay | Admitting: Speech Pathology

## 2020-11-26 ENCOUNTER — Encounter: Payer: Self-pay | Admitting: Speech Pathology

## 2020-11-26 ENCOUNTER — Ambulatory Visit: Payer: Self-pay

## 2020-11-28 ENCOUNTER — Ambulatory Visit: Payer: Self-pay

## 2020-11-28 ENCOUNTER — Encounter: Payer: Self-pay | Admitting: Speech Pathology

## 2020-12-03 ENCOUNTER — Ambulatory Visit: Payer: Self-pay

## 2020-12-03 ENCOUNTER — Encounter: Payer: Self-pay | Admitting: Speech Pathology

## 2020-12-05 ENCOUNTER — Ambulatory Visit: Payer: Self-pay

## 2020-12-05 ENCOUNTER — Encounter: Payer: Self-pay | Admitting: Speech Pathology

## 2020-12-10 ENCOUNTER — Encounter: Payer: Self-pay | Admitting: Speech Pathology

## 2020-12-10 ENCOUNTER — Ambulatory Visit: Payer: Self-pay

## 2020-12-12 ENCOUNTER — Encounter: Payer: Self-pay | Admitting: Speech Pathology

## 2020-12-12 ENCOUNTER — Ambulatory Visit: Payer: Self-pay

## 2020-12-17 ENCOUNTER — Ambulatory Visit: Payer: Self-pay

## 2020-12-17 ENCOUNTER — Encounter: Payer: Self-pay | Admitting: Speech Pathology

## 2020-12-19 ENCOUNTER — Ambulatory Visit: Payer: Self-pay

## 2020-12-19 ENCOUNTER — Encounter: Payer: Self-pay | Admitting: Speech Pathology

## 2020-12-24 ENCOUNTER — Encounter: Payer: Self-pay | Admitting: Speech Pathology

## 2020-12-24 ENCOUNTER — Ambulatory Visit: Payer: Self-pay

## 2020-12-26 ENCOUNTER — Encounter: Payer: Self-pay | Admitting: Speech Pathology

## 2020-12-26 ENCOUNTER — Ambulatory Visit: Payer: Self-pay

## 2020-12-31 ENCOUNTER — Ambulatory Visit: Payer: Self-pay

## 2020-12-31 ENCOUNTER — Encounter: Payer: Self-pay | Admitting: Speech Pathology

## 2021-01-02 ENCOUNTER — Ambulatory Visit: Payer: Self-pay

## 2021-01-02 ENCOUNTER — Encounter: Payer: Self-pay | Admitting: Speech Pathology

## 2021-01-06 ENCOUNTER — Other Ambulatory Visit: Payer: Self-pay | Admitting: Endocrinology

## 2021-01-06 DIAGNOSIS — E038 Other specified hypothyroidism: Secondary | ICD-10-CM

## 2021-01-06 DIAGNOSIS — R7989 Other specified abnormal findings of blood chemistry: Secondary | ICD-10-CM

## 2021-01-06 DIAGNOSIS — E237 Disorder of pituitary gland, unspecified: Secondary | ICD-10-CM

## 2021-01-07 ENCOUNTER — Ambulatory Visit: Payer: Self-pay

## 2021-01-07 ENCOUNTER — Encounter: Payer: Self-pay | Admitting: Speech Pathology

## 2021-01-09 ENCOUNTER — Ambulatory Visit: Payer: Self-pay

## 2021-01-09 ENCOUNTER — Encounter: Payer: Self-pay | Admitting: Speech Pathology

## 2021-01-14 ENCOUNTER — Encounter: Payer: Self-pay | Admitting: Speech Pathology

## 2021-01-14 ENCOUNTER — Other Ambulatory Visit: Payer: Self-pay

## 2021-01-14 ENCOUNTER — Ambulatory Visit: Payer: Self-pay

## 2021-01-15 ENCOUNTER — Ambulatory Visit: Payer: Self-pay

## 2021-01-16 ENCOUNTER — Ambulatory Visit: Payer: Self-pay

## 2021-01-16 ENCOUNTER — Encounter: Payer: Self-pay | Admitting: Speech Pathology

## 2021-01-21 ENCOUNTER — Encounter: Payer: Self-pay | Admitting: Speech Pathology

## 2021-01-21 ENCOUNTER — Ambulatory Visit: Payer: Self-pay

## 2021-01-23 ENCOUNTER — Encounter: Payer: Self-pay | Admitting: Speech Pathology

## 2021-01-23 ENCOUNTER — Ambulatory Visit
Admission: RE | Admit: 2021-01-23 | Discharge: 2021-01-23 | Disposition: A | Discharge status: Home / Self Care | Payer: Worker's Compensation | Source: Ambulatory Visit | Attending: Endocrinology | Admitting: Endocrinology

## 2021-01-23 ENCOUNTER — Ambulatory Visit: Payer: Self-pay

## 2021-01-23 DIAGNOSIS — E237 Disorder of pituitary gland, unspecified: Secondary | ICD-10-CM | POA: Diagnosis present

## 2021-01-23 DIAGNOSIS — E038 Other specified hypothyroidism: Secondary | ICD-10-CM | POA: Insufficient documentation

## 2021-01-23 DIAGNOSIS — R7989 Other specified abnormal findings of blood chemistry: Secondary | ICD-10-CM | POA: Diagnosis present

## 2021-01-23 MED ORDER — GADOBUTROL 1 MMOL/ML IV SOLN
10.0000 mL | Freq: Once | INTRAVENOUS | Status: AC | PRN
  Administered 2021-01-23: 10 mL via INTRAVENOUS

## 2021-01-28 ENCOUNTER — Ambulatory Visit: Payer: Self-pay

## 2021-01-28 ENCOUNTER — Encounter: Payer: Self-pay | Admitting: Speech Pathology

## 2021-01-30 ENCOUNTER — Encounter: Payer: Self-pay | Admitting: Speech Pathology

## 2021-01-30 ENCOUNTER — Ambulatory Visit: Payer: Self-pay

## 2021-02-04 ENCOUNTER — Encounter: Payer: Self-pay | Admitting: Speech Pathology

## 2021-02-04 ENCOUNTER — Ambulatory Visit: Payer: Self-pay

## 2021-02-11 ENCOUNTER — Ambulatory Visit: Payer: Self-pay

## 2021-02-11 ENCOUNTER — Encounter: Payer: Self-pay | Admitting: Speech Pathology

## 2021-02-13 ENCOUNTER — Encounter: Payer: Self-pay | Admitting: Speech Pathology

## 2021-02-13 ENCOUNTER — Ambulatory Visit: Payer: Self-pay

## 2021-02-18 ENCOUNTER — Ambulatory Visit: Payer: Self-pay

## 2021-02-18 ENCOUNTER — Encounter: Payer: Self-pay | Admitting: Speech Pathology

## 2021-02-20 ENCOUNTER — Ambulatory Visit: Payer: Self-pay

## 2021-02-20 ENCOUNTER — Encounter: Payer: Self-pay | Admitting: Speech Pathology

## 2021-02-25 ENCOUNTER — Encounter: Payer: Self-pay | Admitting: Speech Pathology

## 2021-02-25 ENCOUNTER — Ambulatory Visit: Payer: Self-pay

## 2021-02-27 ENCOUNTER — Encounter: Payer: Self-pay | Admitting: Speech Pathology

## 2021-02-27 ENCOUNTER — Ambulatory Visit: Payer: Self-pay

## 2021-03-04 ENCOUNTER — Encounter: Payer: Self-pay | Admitting: Speech Pathology

## 2021-03-04 ENCOUNTER — Ambulatory Visit: Payer: Self-pay

## 2021-03-06 ENCOUNTER — Encounter: Payer: Self-pay | Admitting: Speech Pathology

## 2021-03-06 ENCOUNTER — Ambulatory Visit: Payer: Self-pay

## 2021-03-11 ENCOUNTER — Encounter: Payer: Self-pay | Admitting: Speech Pathology

## 2021-03-11 ENCOUNTER — Ambulatory Visit: Payer: Self-pay

## 2021-03-13 ENCOUNTER — Encounter: Payer: Self-pay | Admitting: Speech Pathology

## 2021-03-13 ENCOUNTER — Ambulatory Visit: Payer: Self-pay

## 2021-03-18 ENCOUNTER — Ambulatory Visit: Payer: Self-pay

## 2021-03-18 ENCOUNTER — Encounter: Payer: Self-pay | Admitting: Speech Pathology

## 2021-03-20 ENCOUNTER — Ambulatory Visit: Payer: Self-pay

## 2021-03-20 ENCOUNTER — Encounter: Payer: Self-pay | Admitting: Speech Pathology

## 2021-03-25 ENCOUNTER — Encounter: Payer: Self-pay | Admitting: Speech Pathology

## 2021-03-25 ENCOUNTER — Ambulatory Visit: Payer: Self-pay

## 2021-03-27 ENCOUNTER — Encounter: Payer: Self-pay | Admitting: Speech Pathology

## 2021-03-27 ENCOUNTER — Ambulatory Visit: Payer: Self-pay

## 2021-03-28 ENCOUNTER — Other Ambulatory Visit: Payer: Self-pay | Admitting: Student

## 2021-03-28 DIAGNOSIS — S43431A Superior glenoid labrum lesion of right shoulder, initial encounter: Secondary | ICD-10-CM

## 2021-03-28 DIAGNOSIS — M25511 Pain in right shoulder: Secondary | ICD-10-CM

## 2021-03-28 DIAGNOSIS — M25311 Other instability, right shoulder: Secondary | ICD-10-CM

## 2021-03-28 DIAGNOSIS — M7581 Other shoulder lesions, right shoulder: Secondary | ICD-10-CM

## 2021-04-01 ENCOUNTER — Ambulatory Visit: Payer: Self-pay

## 2021-04-01 ENCOUNTER — Encounter: Payer: Self-pay | Admitting: Speech Pathology

## 2021-04-03 ENCOUNTER — Ambulatory Visit: Payer: Self-pay

## 2021-04-03 ENCOUNTER — Encounter: Payer: Self-pay | Admitting: Speech Pathology

## 2021-04-08 ENCOUNTER — Ambulatory Visit: Payer: Self-pay

## 2021-04-08 ENCOUNTER — Encounter: Payer: Self-pay | Admitting: Speech Pathology

## 2021-04-10 ENCOUNTER — Encounter: Payer: Self-pay | Admitting: Speech Pathology

## 2021-04-10 ENCOUNTER — Ambulatory Visit: Payer: Self-pay

## 2021-04-11 ENCOUNTER — Ambulatory Visit
Admission: RE | Admit: 2021-04-11 | Discharge: 2021-04-11 | Disposition: A | Discharge status: Home / Self Care | Payer: Worker's Compensation | Source: Ambulatory Visit | Attending: Student | Admitting: Student

## 2021-04-11 ENCOUNTER — Other Ambulatory Visit: Payer: Self-pay

## 2021-04-11 DIAGNOSIS — X58XXXA Exposure to other specified factors, initial encounter: Secondary | ICD-10-CM | POA: Insufficient documentation

## 2021-04-11 DIAGNOSIS — M25511 Pain in right shoulder: Secondary | ICD-10-CM | POA: Diagnosis present

## 2021-04-11 DIAGNOSIS — M25311 Other instability, right shoulder: Secondary | ICD-10-CM | POA: Insufficient documentation

## 2021-04-11 DIAGNOSIS — S43431A Superior glenoid labrum lesion of right shoulder, initial encounter: Secondary | ICD-10-CM | POA: Insufficient documentation

## 2021-04-11 DIAGNOSIS — M7581 Other shoulder lesions, right shoulder: Secondary | ICD-10-CM

## 2021-04-11 MED ORDER — IOHEXOL 180 MG/ML  SOLN
20.0000 mL | Freq: Once | INTRAMUSCULAR | Status: AC | PRN
  Administered 2021-04-11: 13 mL

## 2021-04-11 MED ORDER — LIDOCAINE HCL (PF) 1 % IJ SOLN
10.0000 mL | Freq: Once | INTRAMUSCULAR | Status: AC
  Administered 2021-04-11: 6 mL via INTRADERMAL
  Filled 2021-04-11: qty 10

## 2021-04-11 MED ORDER — SODIUM CHLORIDE (PF) 0.9 % IJ SOLN
20.0000 mL | INTRAMUSCULAR | Status: DC | PRN
  Administered 2021-04-11: 7 mL via INTRAVENOUS

## 2021-04-15 ENCOUNTER — Encounter: Payer: Self-pay | Admitting: Speech Pathology

## 2021-04-15 ENCOUNTER — Ambulatory Visit: Payer: Self-pay

## 2021-04-17 ENCOUNTER — Encounter: Payer: Self-pay | Admitting: Speech Pathology

## 2021-04-17 ENCOUNTER — Ambulatory Visit: Payer: Self-pay

## 2021-04-21 ENCOUNTER — Other Ambulatory Visit: Payer: Self-pay | Admitting: Surgery

## 2021-04-22 ENCOUNTER — Ambulatory Visit: Payer: Self-pay

## 2021-04-22 ENCOUNTER — Encounter: Payer: Self-pay | Admitting: Speech Pathology

## 2021-04-24 ENCOUNTER — Ambulatory Visit: Payer: Self-pay

## 2021-04-30 ENCOUNTER — Encounter
Admission: RE | Admit: 2021-04-30 | Discharge: 2021-04-30 | Disposition: A | Discharge status: Home / Self Care | Payer: Self-pay | Source: Ambulatory Visit | Attending: Surgery | Admitting: Surgery

## 2021-04-30 ENCOUNTER — Other Ambulatory Visit: Payer: Self-pay

## 2021-04-30 HISTORY — DX: Tetany: R29.0

## 2021-04-30 HISTORY — DX: Insomnia, unspecified: G47.00

## 2021-04-30 HISTORY — DX: Diplopia: H53.2

## 2021-04-30 HISTORY — DX: Acute kidney failure, unspecified: N17.9

## 2021-04-30 HISTORY — DX: Gastro-esophageal reflux disease without esophagitis: K21.9

## 2021-04-30 HISTORY — DX: Post-traumatic stress disorder, unspecified: F43.10

## 2021-04-30 HISTORY — DX: Neuralgia and neuritis, unspecified: M79.2

## 2021-04-30 HISTORY — DX: Sepsis, unspecified organism: A41.9

## 2021-04-30 HISTORY — DX: Hypothyroidism, unspecified: E03.9

## 2021-04-30 HISTORY — DX: Hyperlipidemia, unspecified: E78.5

## 2021-04-30 HISTORY — DX: Other intervertebral disc displacement, lumbosacral region: M51.27

## 2021-04-30 HISTORY — DX: Electrocution, initial encounter: T75.4XXA

## 2021-04-30 NOTE — Patient Instructions (Signed)
Your procedure is scheduled on:05-08-21 Thursday Report to the Registration Desk on the 1st floor of the Medical Mall.Then proceed to the 2nd floor Surgery Desk in the Medical Mall To find out your arrival time, please call [phone] between 1PM - 3PM on:05-07-21 Wednesday  REMEMBER: Instructions that are not followed completely may result in serious medical risk, up to and including death; or upon the discretion of your surgeon and anesthesiologist your surgery may need to be rescheduled.  Do not eat food after midnight the night before surgery.  No gum chewing, lozengers or hard candies.  You may however, drink CLEAR liquids up to 2 hours before you are scheduled to arrive for your surgery. Do not drink anything within 2 hours of your scheduled arrival time.  Clear liquids include: - water  - apple juice without pulp - gatorade (not RED, PURPLE, OR BLUE) - black coffee or tea (Do NOT add milk or creamers to the coffee or tea) Do NOT drink anything that is not on this list.  In addition, your doctor has ordered for you to drink the provided  Ensure Pre-Surgery Clear Carbohydrate Drink  Drinking this carbohydrate drink up to two hours before surgery helps to reduce insulin resistance and improve patient outcomes. Please complete drinking 2 hours prior to scheduled arrival time.  TAKE THESE MEDICATIONS THE MORNING OF SURGERY WITH A SIP OF WATER: -carbamazepine (TEGRETOL XR) -clonazePAM (KLONOPIN) -DULoxetine (CYMBALTA)  -fenofibrate (TRICOR)  -gabapentin (NEURONTIN) -levothyroxine (SYNTHROID)  -pantoprazole (PROTONIX) -You may take your haloperidol (HALDOL) the morning of surgery if needed for anxiety  Continue your aspirin 325 MG tablet up until the day prior to surgery as instructed by Dr Joice Lofts in the office-Do NOT take the morning of surgery  One week prior to surgery: Stop Anti-inflammatories (NSAIDS) such as Advil, Aleve, Ibuprofen, Motrin, Naproxen, Naprosyn and Aspirin  based products such as Excedrin, Goodys Powder, BC Powder.You may however,take Tylenol if needed for pain up until the day of surgery.  Stop ANY OVER THE COUNTER supplements/vitamins NOW (04-30-21) until after surgery.  No Alcohol for 24 hours before or after surgery.  No Smoking including e-cigarettes for 24 hours prior to surgery.  No chewable tobacco products for at least 6 hours prior to surgery.  No nicotine patches on the day of surgery.  Do not use any "recreational" drugs for at least a week prior to your surgery.  Please be advised that the combination of cocaine and anesthesia may have negative outcomes, up to and including death. If you test positive for cocaine, your surgery will be cancelled.  On the morning of surgery brush your teeth with toothpaste and water, you may rinse your mouth with mouthwash if you wish. Do not swallow any toothpaste or mouthwash  Use CHG Soap as directed on instruction sheet.  Do not wear jewelry, make-up, hairpins, clips or nail polish.  Do not wear lotions, powders, or perfumes.   Do not shave body from the neck down 48 hours prior to surgery just in case you cut yourself which could leave a site for infection.  Also, freshly shaved skin may become irritated if using the CHG soap.  Contact lenses, hearing aids and dentures may not be worn into surgery.  Do not bring valuables to the hospital. [facility] is not responsible for any missing/lost belongings or valuables.   Notify your doctor if there is any change in your medical condition (cold, fever, infection).  Wear comfortable clothing (specific to your surgery type)  to the hospital.  After surgery, you can help prevent lung complications by doing breathing exercises.  Take deep breaths and cough every 1-2 hours. Your doctor may order a device called an Incentive Spirometer to help you take deep breaths. When coughing or sneezing, hold a pillow firmly against your incision with both  hands. This is called splinting. Doing this helps protect your incision. It also decreases belly discomfort.  If you are being admitted to the hospital overnight, leave your suitcase in the car. After surgery it may be brought to your room.  If you are being discharged the day of surgery, you will not be allowed to drive home. You will need a responsible adult (18 years or older) to drive you home and stay with you that night.   If you are taking public transportation, you will need to have a responsible adult (18 years or older) with you. Please confirm with your physician that it is acceptable to use public transportation.   Please call the Pre-admissions Testing Dept. at [phone] if you have any questions about these instructions.  Surgery Visitation Policy:  Patients undergoing a surgery or procedure may have one family member or support person with them as long as that person is not COVID-19 positive or experiencing its symptoms.  That person may remain in the waiting area during the procedure and may rotate out with other people.  Inpatient Visitation:    Visiting hours are 7 a.m. to 8 p.m. Up to two visitors ages 16+ are allowed at one time in a patient room. The visitors may rotate out with other people during the day. Visitors must check out when they leave, or other visitors will not be allowed. One designated support person may remain overnight. The visitor must pass COVID-19 screenings, use hand sanitizer when entering and exiting the patients room and wear a mask at all times, including in the patients room. Patients must also wear a mask when staff or their visitor are in the room. Masking is required regardless of vaccination status.

## 2021-05-08 ENCOUNTER — Ambulatory Visit: Payer: Worker's Compensation

## 2021-05-08 ENCOUNTER — Ambulatory Visit
Admission: RE | Admit: 2021-05-08 | Discharge: 2021-05-08 | Disposition: A | Discharge status: Home / Self Care | Payer: Worker's Compensation | Attending: Surgery | Admitting: Surgery

## 2021-05-08 ENCOUNTER — Other Ambulatory Visit: Payer: Self-pay

## 2021-05-08 ENCOUNTER — Ambulatory Visit: Payer: Worker's Compensation | Admitting: Urgent Care

## 2021-05-08 ENCOUNTER — Encounter: Payer: Self-pay | Admitting: Surgery

## 2021-05-08 ENCOUNTER — Encounter
Admission: RE | Disposition: A | Discharge status: Home / Self Care | Payer: Self-pay | Source: Home / Self Care | Attending: Surgery

## 2021-05-08 ENCOUNTER — Ambulatory Visit: Payer: Worker's Compensation | Admitting: Anesthesiology

## 2021-05-08 DIAGNOSIS — E7849 Other hyperlipidemia: Secondary | ICD-10-CM | POA: Diagnosis not present

## 2021-05-08 DIAGNOSIS — M25811 Other specified joint disorders, right shoulder: Secondary | ICD-10-CM | POA: Diagnosis present

## 2021-05-08 DIAGNOSIS — E039 Hypothyroidism, unspecified: Secondary | ICD-10-CM | POA: Diagnosis not present

## 2021-05-08 DIAGNOSIS — S43431A Superior glenoid labrum lesion of right shoulder, initial encounter: Secondary | ICD-10-CM | POA: Insufficient documentation

## 2021-05-08 DIAGNOSIS — W85XXXA Exposure to electric transmission lines, initial encounter: Secondary | ICD-10-CM | POA: Insufficient documentation

## 2021-05-08 DIAGNOSIS — K219 Gastro-esophageal reflux disease without esophagitis: Secondary | ICD-10-CM | POA: Insufficient documentation

## 2021-05-08 DIAGNOSIS — Y9389 Activity, other specified: Secondary | ICD-10-CM | POA: Diagnosis not present

## 2021-05-08 DIAGNOSIS — S46011A Strain of muscle(s) and tendon(s) of the rotator cuff of right shoulder, initial encounter: Secondary | ICD-10-CM | POA: Diagnosis not present

## 2021-05-08 DIAGNOSIS — G629 Polyneuropathy, unspecified: Secondary | ICD-10-CM | POA: Insufficient documentation

## 2021-05-08 DIAGNOSIS — Z86711 Personal history of pulmonary embolism: Secondary | ICD-10-CM | POA: Diagnosis not present

## 2021-05-08 DIAGNOSIS — Y990 Civilian activity done for income or pay: Secondary | ICD-10-CM | POA: Diagnosis not present

## 2021-05-08 DIAGNOSIS — M7521 Bicipital tendinitis, right shoulder: Secondary | ICD-10-CM | POA: Insufficient documentation

## 2021-05-08 DIAGNOSIS — F419 Anxiety disorder, unspecified: Secondary | ICD-10-CM | POA: Insufficient documentation

## 2021-05-08 DIAGNOSIS — Z419 Encounter for procedure for purposes other than remedying health state, unspecified: Secondary | ICD-10-CM

## 2021-05-08 HISTORY — PX: SHOULDER ARTHROSCOPY WITH OPEN ROTATOR CUFF REPAIR: SHX6092

## 2021-05-08 SURGERY — ARTHROSCOPY, SHOULDER WITH REPAIR, ROTATOR CUFF, OPEN
Anesthesia: General | Laterality: Right

## 2021-05-08 MED ORDER — CHLORHEXIDINE GLUCONATE 0.12 % MT SOLN
OROMUCOSAL | Status: AC
  Administered 2021-05-08: 15 mL via OROMUCOSAL
  Filled 2021-05-08: qty 15

## 2021-05-08 MED ORDER — BUPIVACAINE HCL (PF) 0.5 % IJ SOLN
INTRAMUSCULAR | Status: DC | PRN
  Administered 2021-05-08: 10 mL via PERINEURAL

## 2021-05-08 MED ORDER — CEFAZOLIN SODIUM-DEXTROSE 2-4 GM/100ML-% IV SOLN
2.0000 g | INTRAVENOUS | Status: AC
  Administered 2021-05-08: 2 g via INTRAVENOUS

## 2021-05-08 MED ORDER — LIDOCAINE HCL (PF) 1 % IJ SOLN
INTRAMUSCULAR | Status: AC
  Filled 2021-05-08: qty 2

## 2021-05-08 MED ORDER — RINGERS IRRIGATION IR SOLN
Status: DC | PRN
  Administered 2021-05-08: 3000 mL

## 2021-05-08 MED ORDER — LIDOCAINE HCL (CARDIAC) PF 100 MG/5ML IV SOSY
PREFILLED_SYRINGE | INTRAVENOUS | Status: DC | PRN
Start: 2021-05-08 — End: 2021-05-08
  Administered 2021-05-08: 100 mg via INTRAVENOUS

## 2021-05-08 MED ORDER — CHLORHEXIDINE GLUCONATE 0.12 % MT SOLN: 15.0000 mL | Freq: Once | OROMUCOSAL | Status: AC

## 2021-05-08 MED ORDER — MIDAZOLAM HCL 2 MG/2ML IJ SOLN
INTRAMUSCULAR | Status: AC
  Filled 2021-05-08: qty 2

## 2021-05-08 MED ORDER — PHENYLEPHRINE HCL-NACL 20-0.9 MG/250ML-% IV SOLN
INTRAVENOUS | Status: DC | PRN
  Administered 2021-05-08: 15 ug/min via INTRAVENOUS

## 2021-05-08 MED ORDER — PHENYLEPHRINE HCL (PRESSORS) 10 MG/ML IV SOLN
INTRAVENOUS | Status: DC | PRN
Start: 2021-05-08 — End: 2021-05-08
  Administered 2021-05-08: 80 ug via INTRAVENOUS

## 2021-05-08 MED ORDER — PROPOFOL 10 MG/ML IV BOLUS
INTRAVENOUS | Status: DC | PRN
Start: 2021-05-08 — End: 2021-05-08
  Administered 2021-05-08: 50 mg via INTRAVENOUS
  Administered 2021-05-08: 200 mg via INTRAVENOUS

## 2021-05-08 MED ORDER — ONDANSETRON 4 MG PO TBDP: 4.0000 mg | ORAL_TABLET | Freq: Three times a day (TID) | ORAL | 1 refills | Status: DC | PRN

## 2021-05-08 MED ORDER — PRAZOSIN HCL 1 MG PO CAPS: 1.0000 mg | ORAL_CAPSULE | Freq: Every day | ORAL | Status: AC

## 2021-05-08 MED ORDER — BUPIVACAINE-EPINEPHRINE 0.5% -1:200000 IJ SOLN
INTRAMUSCULAR | Status: DC | PRN
  Administered 2021-05-08: 30 mL

## 2021-05-08 MED ORDER — SUGAMMADEX SODIUM 500 MG/5ML IV SOLN
INTRAVENOUS | Status: DC | PRN
  Administered 2021-05-08: 500 mg via INTRAVENOUS

## 2021-05-08 MED ORDER — BUPIVACAINE LIPOSOME 1.3 % IJ SUSP
INTRAMUSCULAR | Status: AC
  Filled 2021-05-08: qty 10

## 2021-05-08 MED ORDER — MIDAZOLAM HCL 2 MG/2ML IJ SOLN
INTRAMUSCULAR | Status: DC | PRN
Start: 2021-05-08 — End: 2021-05-08
  Administered 2021-05-08: 2 mg via INTRAVENOUS

## 2021-05-08 MED ORDER — EPHEDRINE SULFATE (PRESSORS) 50 MG/ML IJ SOLN
INTRAMUSCULAR | Status: DC | PRN
  Administered 2021-05-08: 5 mg via INTRAVENOUS

## 2021-05-08 MED ORDER — ACETAMINOPHEN 500 MG PO TABS: 1000.0000 mg | ORAL_TABLET | Freq: Once | ORAL | Status: AC

## 2021-05-08 MED ORDER — DEXAMETHASONE SODIUM PHOSPHATE 10 MG/ML IJ SOLN
INTRAMUSCULAR | Status: DC | PRN
  Administered 2021-05-08: 10 mg via INTRAVENOUS

## 2021-05-08 MED ORDER — BUPIVACAINE HCL (PF) 0.5 % IJ SOLN
INTRAMUSCULAR | Status: AC
  Filled 2021-05-08: qty 10

## 2021-05-08 MED ORDER — LACTATED RINGERS IV SOLN
INTRAVENOUS | Status: DC | PRN
  Administered 2021-05-08: 1 mL

## 2021-05-08 MED ORDER — CEFAZOLIN SODIUM-DEXTROSE 2-4 GM/100ML-% IV SOLN
INTRAVENOUS | Status: AC
  Filled 2021-05-08: qty 100

## 2021-05-08 MED ORDER — PANTOPRAZOLE SODIUM 40 MG PO TBEC: 40.0000 mg | DELAYED_RELEASE_TABLET | Freq: Two times a day (BID) | ORAL | Status: AC

## 2021-05-08 MED ORDER — PHENYLEPHRINE HCL (PRESSORS) 10 MG/ML IV SOLN
INTRAVENOUS | Status: AC
  Filled 2021-05-08: qty 1

## 2021-05-08 MED ORDER — LACTATED RINGERS IV SOLN: INTRAVENOUS | Status: DC

## 2021-05-08 MED ORDER — LIDOCAINE HCL (PF) 1 % IJ SOLN
INTRAMUSCULAR | Status: DC | PRN
  Administered 2021-05-08: 1 mL via SUBCUTANEOUS

## 2021-05-08 MED ORDER — FENTANYL CITRATE (PF) 100 MCG/2ML IJ SOLN
INTRAMUSCULAR | Status: AC
Start: 2021-05-08 — End: ?
  Filled 2021-05-08: qty 2

## 2021-05-08 MED ORDER — PROPOFOL 10 MG/ML IV BOLUS
INTRAVENOUS | Status: AC
  Filled 2021-05-08: qty 20

## 2021-05-08 MED ORDER — ORAL CARE MOUTH RINSE: 15.0000 mL | Freq: Once | OROMUCOSAL | Status: AC

## 2021-05-08 MED ORDER — MIDAZOLAM HCL 2 MG/2ML IJ SOLN: 2.0000 mg | Freq: Once | INTRAMUSCULAR | Status: DC

## 2021-05-08 MED ORDER — ROCURONIUM BROMIDE 100 MG/10ML IV SOLN
INTRAVENOUS | Status: DC | PRN
  Administered 2021-05-08: 30 mg via INTRAVENOUS
  Administered 2021-05-08: 50 mg via INTRAVENOUS

## 2021-05-08 MED ORDER — DEXMEDETOMIDINE (PRECEDEX) IN NS 20 MCG/5ML (4 MCG/ML) IV SYRINGE
PREFILLED_SYRINGE | INTRAVENOUS | Status: DC | PRN
  Administered 2021-05-08: 12 ug via INTRAVENOUS
  Administered 2021-05-08 (×2): 8 ug via INTRAVENOUS

## 2021-05-08 MED ORDER — MIDAZOLAM HCL 2 MG/2ML IJ SOLN: 2.0000 mg | Freq: Once | INTRAMUSCULAR | Status: AC

## 2021-05-08 MED ORDER — MIDAZOLAM HCL 2 MG/2ML IJ SOLN
INTRAMUSCULAR | Status: AC
  Administered 2021-05-08: 2 mg via INTRAVENOUS
  Filled 2021-05-08: qty 2

## 2021-05-08 MED ORDER — BUPIVACAINE LIPOSOME 1.3 % IJ SUSP
INTRAMUSCULAR | Status: DC | PRN
  Administered 2021-05-08: 10 mL via PERINEURAL

## 2021-05-08 MED ORDER — ACETAMINOPHEN 500 MG PO TABS
ORAL_TABLET | ORAL | Status: AC
  Administered 2021-05-08: 1000 mg via ORAL
  Filled 2021-05-08: qty 1

## 2021-05-08 MED ORDER — BUPIVACAINE-EPINEPHRINE (PF) 0.5% -1:200000 IJ SOLN
INTRAMUSCULAR | Status: AC
  Filled 2021-05-08: qty 30

## 2021-05-08 MED ORDER — GLYCOPYRROLATE 0.2 MG/ML IJ SOLN
INTRAMUSCULAR | Status: DC | PRN
  Administered 2021-05-08: .2 mg via INTRAVENOUS

## 2021-05-08 MED ORDER — GABAPENTIN 300 MG PO CAPS: 300.0000 mg | ORAL_CAPSULE | ORAL | Status: AC

## 2021-05-08 MED ORDER — OXYCODONE HCL 5 MG PO TABS: 5.0000 mg | ORAL_TABLET | ORAL | 0 refills | Status: DC | PRN

## 2021-05-08 MED ORDER — ROSUVASTATIN CALCIUM 20 MG PO TABS: 20.0000 mg | ORAL_TABLET | Freq: Every day | ORAL | Status: AC

## 2021-05-08 MED ORDER — FENTANYL CITRATE (PF) 100 MCG/2ML IJ SOLN
INTRAMUSCULAR | Status: DC | PRN
Start: 2021-05-08 — End: 2021-05-08

## 2021-05-08 MED ORDER — ONDANSETRON HCL 4 MG/2ML IJ SOLN
INTRAMUSCULAR | Status: DC | PRN
  Administered 2021-05-08 (×2): 4 mg via INTRAVENOUS

## 2021-05-08 SURGICAL SUPPLY — 52 items
ANCHOR HEALICOIL REGEN 5.5 (Anchor) ×1 IMPLANT
ANCHOR SUT JK SZ 2 2.9 DBL SL (Anchor) ×2 IMPLANT
BIT DRILL JUGRKNT W/NDL BIT2.9 (DRILL) IMPLANT
BLADE FULL RADIUS 3.5 (BLADE) ×2 IMPLANT
BUR ACROMIONIZER 4.0 (BURR) ×2 IMPLANT
CANNULA SHAVER 8MMX76MM (CANNULA) ×2 IMPLANT
CHLORAPREP W/TINT 26 (MISCELLANEOUS) ×2 IMPLANT
COOLER POLAR GLACIER W/PUMP (MISCELLANEOUS) ×1 IMPLANT
COVER MAYO STAND REUSABLE (DRAPES) ×2 IMPLANT
DILATOR 5.5 THREADED HEALICOIL (MISCELLANEOUS) ×1 IMPLANT
DRILL JUGGERKNOT W/NDL BIT 2.9 (DRILL) ×2
ELECT CAUTERY BLADE 6.4 (BLADE) ×2 IMPLANT
ELECT REM PT RETURN 9FT ADLT (ELECTROSURGICAL) ×2
ELECTRODE REM PT RTRN 9FT ADLT (ELECTROSURGICAL) ×1 IMPLANT
GAUZE SPONGE 4X4 12PLY STRL (GAUZE/BANDAGES/DRESSINGS) ×2 IMPLANT
GAUZE XEROFORM 1X8 LF (GAUZE/BANDAGES/DRESSINGS) ×2 IMPLANT
GLOVE SRG 8 PF TXTR STRL LF DI (GLOVE) ×1 IMPLANT
GLOVE SURG ENC MOIS LTX SZ7.5 (GLOVE) ×4 IMPLANT
GLOVE SURG ENC MOIS LTX SZ8 (GLOVE) ×4 IMPLANT
GLOVE SURG UNDER LTX SZ8 (GLOVE) ×2 IMPLANT
GLOVE SURG UNDER POLY LF SZ8 (GLOVE) ×1
GOWN STRL REUS W/ TWL LRG LVL3 (GOWN DISPOSABLE) ×1 IMPLANT
GOWN STRL REUS W/ TWL XL LVL3 (GOWN DISPOSABLE) ×1 IMPLANT
GOWN STRL REUS W/TWL LRG LVL3 (GOWN DISPOSABLE) ×1
GOWN STRL REUS W/TWL XL LVL3 (GOWN DISPOSABLE) ×1
GRASPER SUT 15 45D LOW PRO (SUTURE) IMPLANT
IV LACTATED RINGER IRRG 3000ML (IV SOLUTION) ×2
IV LR IRRIG 3000ML ARTHROMATIC (IV SOLUTION) ×2 IMPLANT
KIT CANNULA 8X76-LX IN CANNULA (CANNULA) IMPLANT
MANIFOLD NEPTUNE II (INSTRUMENTS) ×3 IMPLANT
MASK FACE SPIDER DISP (MASK) ×2 IMPLANT
MAT ABSORB  FLUID 56X50 GRAY (MISCELLANEOUS) ×1
MAT ABSORB FLUID 56X50 GRAY (MISCELLANEOUS) ×1 IMPLANT
PACK ARTHROSCOPY SHOULDER (MISCELLANEOUS) ×2 IMPLANT
PAD ABD DERMACEA PRESS 5X9 (GAUZE/BANDAGES/DRESSINGS) ×4 IMPLANT
PAD WRAPON POLAR SHDR UNIV (MISCELLANEOUS) IMPLANT
PASSER SUT FIRSTPASS SELF (INSTRUMENTS) IMPLANT
SLING ARM LRG DEEP (SOFTGOODS) ×2 IMPLANT
SLING ULTRA II LG (MISCELLANEOUS) ×2 IMPLANT
SPONGE T-LAP 18X18 ~~LOC~~+RFID (SPONGE) ×2 IMPLANT
STAPLER SKIN PROX 35W (STAPLE) ×2 IMPLANT
STRAP SAFETY 5IN WIDE (MISCELLANEOUS) ×2 IMPLANT
SUT ETHIBOND 0 MO6 C/R (SUTURE) ×2 IMPLANT
SUT ULTRABRAID 2 COBRAID 38 (SUTURE) IMPLANT
SUT VIC AB 2-0 CT1 27 (SUTURE) ×2
SUT VIC AB 2-0 CT1 TAPERPNT 27 (SUTURE) ×2 IMPLANT
TAPE MICROFOAM 4IN (TAPE) ×2 IMPLANT
TUBING CONNECTING 10 (TUBING) ×2 IMPLANT
TUBING INFLOW SET DBFLO PUMP (TUBING) ×2 IMPLANT
WAND WEREWOLF FLOW 90D (MISCELLANEOUS) ×2 IMPLANT
WATER STERILE IRR 500ML POUR (IV SOLUTION) ×2 IMPLANT
WRAPON POLAR PAD SHDR UNIV (MISCELLANEOUS) ×2

## 2021-05-08 NOTE — Anesthesia Procedure Notes (Signed)
Anesthesia Regional Block: Interscalene brachial plexus block   Pre-Anesthetic Checklist: , timeout performed,  Correct Patient, Correct Site, Correct Laterality,  Correct Procedure, Correct Position, site marked,  Risks and benefits discussed,  Surgical consent,  Pre-op evaluation,  At surgeon's request and post-op pain management  Laterality: Upper and Right  Prep: chloraprep       Needles:  Injection technique: Single-shot  Needle Type: Stimiplex     Needle Length: 9cm  Needle Gauge: 22     Additional Needles:   Procedures:,,,, ultrasound used (permanent image in chart),,    Narrative:  Start time: 05/08/2021 11:28 AM End time: 05/08/2021 11:32 AM Injection made incrementally with aspirations every 20 mL.  Performed by: Personally  Anesthesiologist: Foye Deer, MD  Additional Notes: Patient consented for risk and benefits of nerve block including but not limited to nerve damage, failed block, bleeding and infection.  Patient voiced understanding.  Functioning IV was confirmed and monitors were applied.  Timeout done prior to procedure and prior to any sedation being given to the patient.  Patient confirmed procedure site prior to any sedation given to the patient.  A 85mm 22ga Stimuplex needle was used. Sterile prep,hand hygiene and sterile gloves were used.  Minimal sedation used for procedure.  No paresthesia endorsed by patient during the procedure.  Negative aspiration and negative test dose prior to incremental administration of local anesthetic. The patient tolerated the procedure well with no immediate complications.

## 2021-05-08 NOTE — Anesthesia Procedure Notes (Signed)
Procedure Name: Intubation Date/Time: 05/08/2021 12:43 PM Performed by: Kelton Pillar, CRNA Pre-anesthesia Checklist: Patient identified, Emergency Drugs available, Suction available and Patient being monitored Patient Re-evaluated:Patient Re-evaluated prior to induction Oxygen Delivery Method: Circle system utilized Preoxygenation: Pre-oxygenation with 100% oxygen Induction Type: IV induction Ventilation: Mask ventilation without difficulty Laryngoscope Size: McGraph and 3 Grade View: Grade I Tube type: Oral Number of attempts: 1 Airway Equipment and Method: Stylet and Oral airway Placement Confirmation: ETT inserted through vocal cords under direct vision, positive ETCO2, breath sounds checked- equal and bilateral and CO2 detector Secured at: 21 cm Tube secured with: Tape Dental Injury: Teeth and Oropharynx as per pre-operative assessment

## 2021-05-08 NOTE — Discharge Instructions (Addendum)
Orthopedic discharge instructions: Keep dressing dry and intact.  May shower after dressing changed on post-op day #4 (Monday).  Cover staples/sutures with Band-Aids after drying off. Apply ice frequently to shoulder. Take ibuprofen 600-800 mg TID with meals for 7-10 days, then as necessary. Take oxycodone as prescribed when needed.  May supplement with ES Tylenol if necessary. Keep shoulder immobilizer on at all times except may remove for bathing purposes. Follow-up in 10-14 days or as scheduled.  AMBULATORY SURGERY  DISCHARGE INSTRUCTIONS   The drugs that you were given will stay in your system until tomorrow so for the next 24 hours you should not:  Drive an automobile Make any legal decisions Drink any alcoholic beverage   You may resume regular meals tomorrow.  Today it is better to start with liquids and gradually work up to solid foods.  You may eat anything you prefer, but it is better to start with liquids, then soup and crackers, and gradually work up to solid foods.   Please notify your doctor immediately if you have any unusual bleeding, trouble breathing, redness and pain at the surgery site, drainage, fever, or pain not relieved by medication.    Additional Instructions: PLEASE LEAVE GREEN BRACELET ON FOR 72 HOURS     Please contact your physician with any problems or Same Day Surgery at [phone], Monday through Friday 6 am to 4 pm, or [facility] number at [phone].      Interscalene Nerve Block with Exparel   For your surgery you have received an Interscalene Nerve Block with Exparel. Nerve Blocks affect many types of nerves, including nerves that control movement, pain and normal sensation.  You may experience feelings such as numbness, tingling, heaviness, weakness or the inability to move your arm or the feeling or sensation that your arm has "fallen asleep". A nerve block with Exparel can last up to 5 days.  Usually the weakness  wears off first.  The tingling and heaviness usually wear off next.  Finally you may start to notice pain.  Keep in mind that this may occur in any order.  Once a nerve block starts to wear off it is usually completely gone within 60 minutes. ISNB may cause mild shortness of breath, a hoarse voice, blurry vision, unequal pupils, or drooping of the face on the same side as the nerve block.  These symptoms will usually resolve with the numbness.  Very rarely the procedure itself can cause mild seizures. If needed, your surgeon will give you a prescription for pain medication.  It will take about 60 minutes for the oral pain medication to become fully effective.  So, it is recommended that you start taking this medication before the nerve block first begins to wear off, or when you first begin to feel discomfort. Take your pain medication only as prescribed.  Pain medication can cause sedation and decrease your breathing if you take more than you need for the level of pain that you have. Nausea is a common side effect of many pain medications.  You may want to eat something before taking your pain medicine to prevent nausea. After an Interscalene nerve block, you cannot feel pain, pressure or extremes in temperature in the effected arm.  Because your arm is numb it is at an increased risk for injury.  To decrease the possibility of injury, please practice the following:  While you are awake change the position of your arm frequently to prevent too much pressure  on any one area for prolonged periods of time.  If you have a cast or tight dressing, check the color or your fingers every couple of hours.  Call your surgeon with the appearance of any discoloration (white or blue). If you are given a sling to wear before you go home, please wear it  at all times until the block has completely worn off.  Do not get up at night without your sling. Please contact [facility] or your surgeon if you do not begin to  regain sensation after 7 days from the surgery.  Anesthesia may be contacted by calling the Same Day Surgery Department, Mon. through Fri., 6 am to 4 pm at [phone].   If you experience any other problems or concerns, please contact your surgeon's office. If you experience severe or prolonged shortness of breath go to the nearest emergency department.   SHOULDER SLING IMMOBILIZER   VIDEO Slingshot 2 Shoulder Brace Application - YouTube ---[email or web address]  INSTRUCTIONS While supporting the injured arm, slide the forearm into the sling. Wrap the adjustable shoulder strap around the neck and shoulders and attach the strap end to the sling using  the alligator strap tab.  Adjust the shoulder strap to the required length. Position the shoulder pad behind the neck. To secure the shoulder pad location (optional), pull the shoulder strap away from the shoulder pad, unfold the hook material on the top of the pad, then press the shoulder strap back onto the hook material to secure the pad in place. Attach the closure strap across the open top of the sling. Position the strap so that it holds the arm securely in the sling. Next, attach the thumb strap to the open end of the sling between the thumb and fingers. After sling has been fit, it may be easily removed and reapplied using the quick release buckle on shoulder strap. If a neutral pillow or 15 abduction pillow is included, place the pillow at the waistline. Attach the sling to the pillow, lining up hook material on the pillow with the loop on sling. Adjust the waist strap to fit.  If waist strap is too long, cut it to fit. Use the small piece of double sided hook material (located on top of the pillow) to secure the strap end. Place the double sided hook material on the inside of the cut strap end and secure it to the waist strap.     If no pillow is included, attach the waist strap to the sling and adjust to fit.     Washing Instructions: Straps and sling must be removed and cleaned regularly depending on your activity level and perspiration. Hand wash straps and sling in cold water with mild detergent, rinse, air dry

## 2021-05-08 NOTE — Transfer of Care (Signed)
Immediate Anesthesia Transfer of Care Note  Patient: TAMIR WALLMAN III  Procedure(s) Performed: Right shoulder arthroscopy with debridement, decompression, SLAP repair, rotator cuff repair and biceps tenodesis (Right)  Patient Location: PACU  Anesthesia Type:General  Level of Consciousness: drowsy and patient cooperative  Airway & Oxygen Therapy: Patient Spontanous Breathing and Patient connected to face mask oxygen  Post-op Assessment: Report given to RN and Post -op Vital signs reviewed and stable  Post vital signs: Reviewed and stable  Last Vitals:  Vitals Value Taken Time  BP 131/72 05/08/21 1445  Temp 36.2 C 05/08/21 1445  Pulse 82 05/08/21 1447  Resp    SpO2 95 % 05/08/21 1447  Vitals shown include unvalidated device data.  Last Pain:  Vitals:   05/08/21 1048  TempSrc: Temporal  PainSc: 7       Patients Stated Pain Goal: 3 (05/08/21 1048)  Complications: No notable events documented.
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
## 2021-05-08 NOTE — Progress Notes (Signed)
Pt had a question about continuing his 325 mg aspirin and Dr. Binnie Rail d/c instruction regarding taking ibuprofen for 5-7 days.  Per Dr. Joice Lofts, pt was going to make sure ibuprofen did not interfere with any of his meds.  If there is no contraindication, then OK to take ibuprofen for 5-7 days and continuing his daily 325 mg aspirin.  If there is a conflict, then do not take ibuprofen.  Attempted to call pt with phone number on file multiple times.  The call is not able to be completed per the recording.

## 2021-05-09 ENCOUNTER — Encounter: Payer: Self-pay | Admitting: Surgery

## 2021-05-09 NOTE — Anesthesia Postprocedure Evaluation (Signed)
Anesthesia Post Note  Patient: Jason Conner  Procedure(s) Performed: Extensive arthroscopic debridement, arthroscopic subacromial decompression, mini-open rotator cuff repair, and mini-open biceps tenodesis, right shoulder. (Right)  Patient location during evaluation: PACU Anesthesia Type: General and Regional Level of consciousness: awake and alert, oriented and patient cooperative Pain management: pain level controlled Vital Signs Assessment: post-procedure vital signs reviewed and stable Respiratory status: spontaneous breathing, nonlabored ventilation and respiratory function stable Cardiovascular status: blood pressure returned to baseline and stable Postop Assessment: adequate PO intake Anesthetic complications: no   No notable events documented.   Last Vitals:  Vitals:   05/08/21 1545 05/08/21 1605  BP: 123/85 (!) 123/98  Pulse: 75 79  Resp: 13 14  Temp:  (!) 36.1 C  SpO2: 92% 95%    Last Pain:  Vitals:   05/08/21 1605  TempSrc: Temporal  PainSc: 0-No pain                 Reed Breech

## 2021-05-13 ENCOUNTER — Other Ambulatory Visit: Payer: Self-pay | Admitting: Internal Medicine

## 2021-05-13 ENCOUNTER — Emergency Department: Payer: Worker's Compensation

## 2021-05-13 ENCOUNTER — Emergency Department
Admission: EM | Admit: 2021-05-13 | Discharge: 2021-05-13 | Disposition: A | Discharge status: Home / Self Care | Payer: Worker's Compensation | Attending: Emergency Medicine | Admitting: Emergency Medicine

## 2021-05-13 DIAGNOSIS — R000 Tachycardia, unspecified: Secondary | ICD-10-CM

## 2021-05-13 DIAGNOSIS — J384 Edema of larynx: Secondary | ICD-10-CM | POA: Insufficient documentation

## 2021-05-13 DIAGNOSIS — Z20822 Contact with and (suspected) exposure to covid-19: Secondary | ICD-10-CM | POA: Diagnosis not present

## 2021-05-13 DIAGNOSIS — R0602 Shortness of breath: Secondary | ICD-10-CM | POA: Diagnosis present

## 2021-05-13 DIAGNOSIS — R0600 Dyspnea, unspecified: Secondary | ICD-10-CM

## 2021-05-13 DIAGNOSIS — R062 Wheezing: Secondary | ICD-10-CM

## 2021-05-13 LAB — BRAIN NATRIURETIC PEPTIDE: B Natriuretic Peptide: 20.9 pg/mL (ref 0.0–100.0)

## 2021-05-13 LAB — CBC WITH DIFFERENTIAL/PLATELET
Abs Immature Granulocytes: 0.05 10*3/uL (ref 0.00–0.07)
Basophils Absolute: 0 10*3/uL (ref 0.0–0.1)
Basophils Relative: 1 %
Eosinophils Absolute: 0.4 10*3/uL (ref 0.0–0.5)
Eosinophils Relative: 5 %
HCT: 44.2 % (ref 39.0–52.0)
Hemoglobin: 14.7 g/dL (ref 13.0–17.0)
Immature Granulocytes: 1 %
Lymphocytes Relative: 37 %
Lymphs Abs: 2.9 10*3/uL (ref 0.7–4.0)
MCH: 32 pg (ref 26.0–34.0)
MCHC: 33.3 g/dL (ref 30.0–36.0)
MCV: 96.3 fL (ref 80.0–100.0)
Monocytes Absolute: 0.8 10*3/uL (ref 0.1–1.0)
Monocytes Relative: 10 %
Neutro Abs: 3.8 10*3/uL (ref 1.7–7.7)
Neutrophils Relative %: 46 %
Platelets: 268 10*3/uL (ref 150–400)
RBC: 4.59 MIL/uL (ref 4.22–5.81)
RDW: 12.9 % (ref 11.5–15.5)
WBC: 8 10*3/uL (ref 4.0–10.5)
nRBC: 0 % (ref 0.0–0.2)

## 2021-05-13 LAB — COMPREHENSIVE METABOLIC PANEL
ALT: 33 U/L (ref 0–44)
AST: 44 U/L — ABNORMAL HIGH (ref 15–41)
Albumin: 3.5 g/dL (ref 3.5–5.0)
Alkaline Phosphatase: 32 U/L — ABNORMAL LOW (ref 38–126)
Anion gap: 11 (ref 5–15)
BUN: 10 mg/dL (ref 6–20)
CO2: 29 mmol/L (ref 22–32)
Calcium: 9.5 mg/dL (ref 8.9–10.3)
Chloride: 100 mmol/L (ref 98–111)
Creatinine, Ser: 1.33 mg/dL — ABNORMAL HIGH (ref 0.61–1.24)
GFR, Estimated: >60 mL/min (ref >60)
Glucose, Bld: 90 mg/dL (ref 70–99)
Potassium: 4.2 mmol/L (ref 3.5–5.1)
Sodium: 140 mmol/L (ref 135–145)
Total Bilirubin: 0.4 mg/dL (ref 0.3–1.2)
Total Protein: 6.8 g/dL (ref 6.5–8.1)

## 2021-05-13 LAB — RESP PANEL BY RT-PCR (FLU A&B, COVID) ARPGX2
Influenza A by PCR: NEGATIVE
Influenza B by PCR: NEGATIVE
SARS Coronavirus 2 by RT PCR: NEGATIVE

## 2021-05-13 LAB — TROPONIN I (HIGH SENSITIVITY): Troponin I (High Sensitivity): 3 ng/L (ref <18)

## 2021-05-13 LAB — APTT: aPTT: 27 seconds (ref 24–36)

## 2021-05-13 LAB — PROTIME-INR
INR: 0.9 (ref 0.8–1.2)
Prothrombin Time: 11.8 seconds (ref 11.4–15.2)

## 2021-05-13 MED ORDER — RACEPINEPHRINE HCL 2.25 % IN NEBU
0.5000 mL | INHALATION_SOLUTION | Freq: Once | RESPIRATORY_TRACT | Status: AC
  Administered 2021-05-13: 0.5 mL via RESPIRATORY_TRACT
  Filled 2021-05-13: qty 0.5

## 2021-05-13 MED ORDER — ALBUTEROL SULFATE HFA 108 (90 BASE) MCG/ACT IN AERS: 2.0000 | INHALATION_SPRAY | RESPIRATORY_TRACT | 0 refills | Status: AC | PRN

## 2021-05-13 MED ORDER — LIDOCAINE HCL URETHRAL/MUCOSAL 2 % EX GEL
1.0000 "application " | Freq: Once | CUTANEOUS | Status: AC
  Administered 2021-05-13: 1 via TOPICAL
  Filled 2021-05-13: qty 10

## 2021-05-13 MED ORDER — METHYLPREDNISOLONE 4 MG PO TBPK: ORAL_TABLET | ORAL | 0 refills | Status: DC

## 2021-05-13 MED ORDER — BUTAMBEN-TETRACAINE-BENZOCAINE 2-2-14 % EX AERO
1.0000 | INHALATION_SPRAY | Freq: Once | CUTANEOUS | Status: AC
  Administered 2021-05-13: 1 via TOPICAL
  Filled 2021-05-13: qty 5

## 2021-05-13 MED ORDER — IOHEXOL 350 MG/ML SOLN
100.0000 mL | Freq: Once | INTRAVENOUS | Status: AC | PRN
  Administered 2021-05-13: 100 mL via INTRAVENOUS

## 2021-05-13 MED ORDER — ALBUTEROL SULFATE HFA 108 (90 BASE) MCG/ACT IN AERS
2.0000 | INHALATION_SPRAY | Freq: Once | RESPIRATORY_TRACT | Status: AC
  Administered 2021-05-13: 2 via RESPIRATORY_TRACT
  Filled 2021-05-13: qty 6.7

## 2021-05-13 MED ORDER — OXYCODONE-ACETAMINOPHEN 5-325 MG PO TABS
2.0000 | ORAL_TABLET | Freq: Once | ORAL | Status: AC
  Administered 2021-05-13: 2 via ORAL
  Filled 2021-05-13: qty 2

## 2021-05-13 MED ORDER — OXYMETAZOLINE HCL 0.05 % NA SOLN
1.0000 | Freq: Once | NASAL | Status: AC
  Administered 2021-05-13: 1 via NASAL
  Filled 2021-05-13: qty 30

## 2021-05-13 MED ORDER — DEXAMETHASONE SODIUM PHOSPHATE 10 MG/ML IJ SOLN
10.0000 mg | Freq: Once | INTRAMUSCULAR | Status: AC
  Administered 2021-05-13: 10 mg via INTRAVENOUS
  Filled 2021-05-13: qty 1

## 2021-05-13 NOTE — ED Notes (Signed)
ED Provider at bedside. 

## 2021-05-13 NOTE — ED Notes (Signed)
Informed EDP pt requesting 10mg  oxy as would have taken at home post shoulder surgery.
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
## 2021-05-13 NOTE — Discharge Instructions (Signed)
Take the steroid as prescribed.  Use the albuterol inhaler 2 to 4 puffs, every 4 hours as needed.  Call ENT for follow-up this week.  Return to the ER with any worsening shortness of breath, difficulty swallowing, or difficulty breathing.

## 2021-05-13 NOTE — ED Provider Triage Note (Signed)
Emergency Medicine Provider Triage Evaluation Note  Jason Conner , a 41 y.o. male  was evaluated in triage.  Pt complains of shortness of breath.  Patient had left shoulder surgery 1 week ago.  Concerns for PE.  Had chest pain at [facility] clinic and was rushed over here to the ED.[name]  Review of Systems  Positive: Chest pain, shortness of breath Negative: Fever  Physical Exam  There were no vitals taken for this visit. Gen:   Awake, no distress   Resp:  Normal effort, wheezing noted MSK:   Moves extremities without difficulty  Other:    Medical Decision Making  Medically screening exam initiated at 3:17 PM.  Appropriate orders placed.  [name] was informed that the remainder of the evaluation will be completed by another provider, this initial triage assessment does not replace that evaluation, and the importance of remaining in the ED until their evaluation is complete.  We will order protocols for chest pain and also CTA for PE due recent surgery   Faythe Ghee, PA-C 05/13/21 1519

## 2021-05-13 NOTE — ED Notes (Signed)
Albuterol inhaler ordered from pharmacy  ?

## 2021-05-13 NOTE — ED Notes (Signed)
Pt discharge information reviewed. Pt understands need for follow up care and when to return if symptoms worsen. All questions answered. Pt is alert and oriented with even and regular respirations. Pt is seen ambulating out of department with string steady gait with family.  °

## 2021-05-13 NOTE — ED Triage Notes (Incomplete)
Pt via on stretcher from [facility], arrival complaint with active seizure, on arrival pt was responsive to nursing staff. Per KC, pt was shaking and having convulsions but nothing was given by [facility]. Pt states had a recent shoulder surgery and complaining of CP and SOB, pt wheezing on arrival. Pt is A&OX4
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
## 2021-06-11 ENCOUNTER — Other Ambulatory Visit: Payer: Self-pay | Admitting: Unknown Physician Specialty

## 2021-06-11 DIAGNOSIS — J9589 Other postprocedural complications and disorders of respiratory system, not elsewhere classified: Secondary | ICD-10-CM

## 2021-06-24 ENCOUNTER — Ambulatory Visit
Admission: RE | Admit: 2021-06-24 | Discharge: 2021-06-24 | Disposition: A | Discharge status: Home / Self Care | Payer: Worker's Compensation | Source: Ambulatory Visit | Attending: Unknown Physician Specialty | Admitting: Unknown Physician Specialty

## 2021-06-24 DIAGNOSIS — J9589 Other postprocedural complications and disorders of respiratory system, not elsewhere classified: Secondary | ICD-10-CM | POA: Diagnosis not present

## 2021-06-24 DIAGNOSIS — R1314 Dysphagia, pharyngoesophageal phase: Secondary | ICD-10-CM | POA: Diagnosis present
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
## 2021-07-13 IMAGING — DX DG ABD PORTABLE 1V
1 series · 1 of 1 positions shown · non-contrast
Comparison: CT 10/06/2019.

CLINICAL DATA: OG tube placement.

EXAM:
PORTABLE ABDOMEN - 1 VIEW

[abdomen supine]
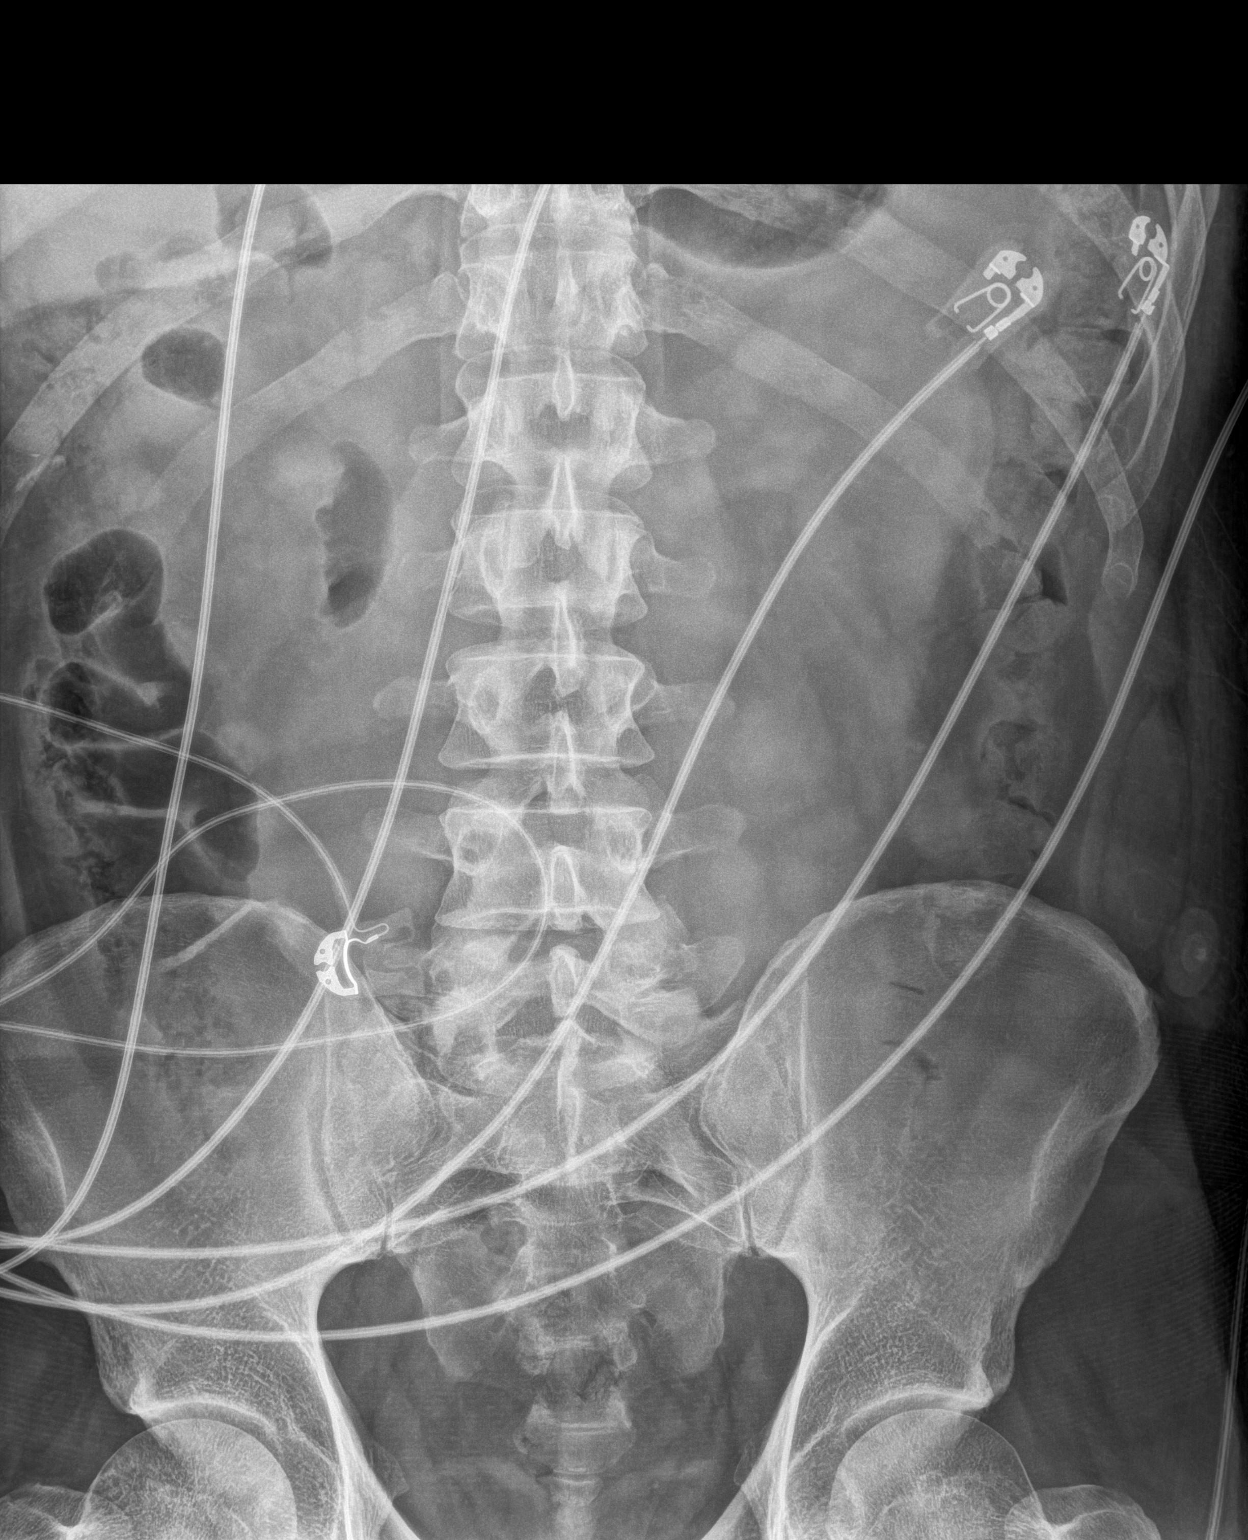

[1 of 1 positions shown; findings below may reference images not displayed]

FINDINGS: OG tube noted with tip coiled in the stomach. No bowel distention.
Stool noted throughout the colon. No acute bony abnormality.
IMPRESSION: OG tube noted with tip coiled in the stomach. No bowel distention
noted. Stool noted throughout the colon.

## 2021-10-27 ENCOUNTER — Ambulatory Visit: Payer: Self-pay | Admitting: Gastroenterology

## 2021-10-27 ENCOUNTER — Ambulatory Visit (INDEPENDENT_AMBULATORY_CARE_PROVIDER_SITE_OTHER): Payer: No Typology Code available for payment source | Admitting: Gastroenterology

## 2021-10-27 ENCOUNTER — Encounter: Payer: Self-pay | Admitting: Gastroenterology

## 2021-10-27 VITALS — BP 137/90 | HR 76 | Temp 98.7°F | Ht 74.0 in | Wt 245.0 lb

## 2021-10-27 DIAGNOSIS — R1310 Dysphagia, unspecified: Secondary | ICD-10-CM

## 2021-10-27 DIAGNOSIS — K625 Hemorrhage of anus and rectum: Secondary | ICD-10-CM

## 2021-10-27 MED ORDER — PEG 3350-KCL-NA BICARB-NACL 420 G PO SOLR: 4000.0000 mL | Freq: Once | ORAL | 0 refills | Status: AC

## 2021-10-27 MED ORDER — HYDROCORTISONE ACETATE 25 MG RE SUPP: 25.0000 mg | Freq: Two times a day (BID) | RECTAL | 1 refills | Status: AC
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
## 2021-11-06 ENCOUNTER — Other Ambulatory Visit: Payer: Self-pay

## 2021-11-25 ENCOUNTER — Encounter: Payer: Self-pay | Admitting: Gastroenterology

## 2021-11-27 ENCOUNTER — Ambulatory Visit (AMBULATORY_SURGERY_CENTER): Payer: No Typology Code available for payment source | Admitting: General Practice

## 2021-11-27 ENCOUNTER — Ambulatory Visit
Admission: RE | Admit: 2021-11-27 | Discharge: 2021-11-27 | Disposition: A | Discharge status: Home / Self Care | Payer: No Typology Code available for payment source | Attending: Gastroenterology | Admitting: Gastroenterology

## 2021-11-27 ENCOUNTER — Ambulatory Visit: Payer: No Typology Code available for payment source | Admitting: General Practice

## 2021-11-27 ENCOUNTER — Encounter
Admission: RE | Disposition: A | Discharge status: Home / Self Care | Payer: Self-pay | Source: Home / Self Care | Attending: Gastroenterology

## 2021-11-27 ENCOUNTER — Encounter: Payer: Self-pay | Admitting: Gastroenterology

## 2021-11-27 ENCOUNTER — Other Ambulatory Visit: Payer: Self-pay

## 2021-11-27 DIAGNOSIS — R1310 Dysphagia, unspecified: Secondary | ICD-10-CM | POA: Diagnosis present

## 2021-11-27 DIAGNOSIS — K219 Gastro-esophageal reflux disease without esophagitis: Secondary | ICD-10-CM | POA: Diagnosis not present

## 2021-11-27 DIAGNOSIS — K2289 Other specified disease of esophagus: Secondary | ICD-10-CM

## 2021-11-27 DIAGNOSIS — F419 Anxiety disorder, unspecified: Secondary | ICD-10-CM | POA: Insufficient documentation

## 2021-11-27 DIAGNOSIS — Z79899 Other long term (current) drug therapy: Secondary | ICD-10-CM | POA: Insufficient documentation

## 2021-11-27 DIAGNOSIS — Z86711 Personal history of pulmonary embolism: Secondary | ICD-10-CM | POA: Insufficient documentation

## 2021-11-27 DIAGNOSIS — K2950 Unspecified chronic gastritis without bleeding: Secondary | ICD-10-CM | POA: Diagnosis not present

## 2021-11-27 HISTORY — PX: ESOPHAGOGASTRODUODENOSCOPY: SHX5428

## 2021-11-27 HISTORY — DX: Unspecified convulsions: R56.9

## 2021-11-27 HISTORY — PX: ESOPHAGEAL DILATION: SHX303

## 2021-11-27 SURGERY — EGD (ESOPHAGOGASTRODUODENOSCOPY)
Anesthesia: General | Site: Mouth

## 2021-11-27 MED ORDER — LACTATED RINGERS IV SOLN: INTRAVENOUS | Status: DC

## 2021-11-27 MED ORDER — LIDOCAINE HCL (CARDIAC) PF 100 MG/5ML IV SOSY
PREFILLED_SYRINGE | INTRAVENOUS | Status: DC | PRN
  Administered 2021-11-27: 100 mg via INTRAVENOUS

## 2021-11-27 MED ORDER — GLYCOPYRROLATE PF 0.2 MG/ML IJ SOSY
PREFILLED_SYRINGE | INTRAMUSCULAR | Status: DC | PRN
  Administered 2021-11-27: .1 mg via INTRAVENOUS

## 2021-11-27 MED ORDER — PROPOFOL 10 MG/ML IV BOLUS
INTRAVENOUS | Status: DC | PRN
  Administered 2021-11-27 (×3): 50 mg via INTRAVENOUS
  Administered 2021-11-27 (×2): 25 mg via INTRAVENOUS
  Administered 2021-11-27 (×2): 50 mg via INTRAVENOUS
  Administered 2021-11-27: 25 mg via INTRAVENOUS

## 2021-11-27 MED ORDER — STERILE WATER FOR IRRIGATION IR SOLN
Status: DC | PRN
  Administered 2021-11-27: 100 mL

## 2021-11-27 MED ORDER — SODIUM CHLORIDE 0.9 % IV SOLN: INTRAVENOUS | Status: DC

## 2021-11-27 SURGICAL SUPPLY — 11 items
BALLN DILATOR 15-18 8 (BALLOONS) ×1
BALLOON DILATOR 15-18 8 (BALLOONS) IMPLANT
BLOCK BITE 60FR ADLT L/F GRN (MISCELLANEOUS) ×1 IMPLANT
FORCEPS BIOP RAD 4 LRG CAP 4 (CUTTING FORCEPS) IMPLANT
GOWN CVR UNV OPN BCK APRN NK (MISCELLANEOUS) ×2 IMPLANT
GOWN ISOL THUMB LOOP REG UNIV (MISCELLANEOUS) ×2
KIT PRC NS LF DISP ENDO (KITS) ×1 IMPLANT
KIT PROCEDURE OLYMPUS (KITS) ×1
MANIFOLD NEPTUNE II (INSTRUMENTS) ×1 IMPLANT
SYR INFLATION 60ML (SYRINGE) IMPLANT
WATER STERILE IRR 250ML POUR (IV SOLUTION) ×1 IMPLANT
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
## 2021-11-27 NOTE — Transfer of Care (Signed)
Immediate Anesthesia Transfer of Care Note  Patient: Jason Conner  Procedure(s) Performed: ESOPHAGOGASTRODUODENOSCOPY (EGD) WITH BIOPSY (Mouth) ESOPHAGEAL DILATION (Mouth)  Patient Location: PACU  Anesthesia Type: General  Level of Consciousness: awake, alert  and patient cooperative  Airway and Oxygen Therapy: Patient Spontanous Breathing and Patient connected to supplemental oxygen  Post-op Assessment: Post-op Vital signs reviewed, Patient's Cardiovascular Status Stable, Respiratory Function Stable, Patent Airway and No signs of Nausea or vomiting  Post-op Vital Signs: Reviewed and stable  Complications: There were no known notable events for this encounter.

## 2021-11-27 NOTE — Op Note (Signed)
[facility] Gastroenterology Patient Name: Jason Conner Procedure Date: 11/27/2021 9:31 AM MRN: [id number] Account #: [id number] Date of Birth: [birth year] Admit Type: Outpatient Age: 41 Room: [facility] OR ROOM 01 Gender: Male Note Status: Finalized Instrument Name: [id number] Procedure:             Upper GI endoscopy Indications:           Dysphagia Providers:             Lucilla Lame MD, MD Referring MD:          Rusty Aus, MD (Referring MD) Medicines:             Propofol per Anesthesia Complications:         No immediate complications. Procedure:             Pre-Anesthesia Assessment:                        - Prior to the procedure, a History and Physical was                         performed, and patient medications and allergies were                         reviewed. The patient's tolerance of previous                         anesthesia was also reviewed. The risks and benefits                         of the procedure and the sedation options and risks                         were discussed with the patient. All questions were                         answered, and informed consent was obtained. Prior                         Anticoagulants: The patient has taken no previous                         anticoagulant or antiplatelet agents. ASA Grade                         Assessment: II - A patient with mild systemic disease.                         After reviewing the risks and benefits, the patient                         was deemed in satisfactory condition to undergo the                         procedure.                        After obtaining informed consent, the endoscope was  passed under direct vision. Throughout the procedure,                         the patient's blood pressure, pulse, and oxygen                         saturations were monitored continuously. The Endoscope                         was introduced through the mouth, and  advanced to the                         second part of duodenum. The upper GI endoscopy was                         accomplished without difficulty. The patient tolerated                         the procedure well. Findings:      The Z-line was irregular and was found at the gastroesophageal junction.       Biopsies were taken with a cold forceps for histology.      The stomach was normal.      The examined duodenum was normal.      Biopsies were obtained with cold forceps for histology in the middle       third of the esophagus. Impression:            - Z-line irregular, at the gastroesophageal junction.                         Biopsied. R/O short segment barrett's                        - Normal stomach.                        - Normal examined duodenum.                        - Biopsy performed in the middle third of the                         esophagus. Recommendation:        - Discharge patient to home.                        - Resume previous diet.                        - Continue present medications.                        - Await pathology results. Procedure Code(s):     --- Professional ---                        [phone], Esophagogastroduodenoscopy, flexible,                         transoral; with biopsy, single or multiple Diagnosis Code(s):     --- Professional ---  R13.10, Dysphagia, unspecified                        K22.8, Other specified diseases of esophagus CPT copyright 2019 American Medical Association. All rights reserved. The codes documented in this report are preliminary and upon coder review may  be revised to meet current compliance requirements. Midge Minium MD, MD 11/27/2021 9:52:21 AM This report has been signed electronically. Number of Addenda: 0 Note Initiated On: 11/27/2021 9:31 AM Total Procedure Duration: 0 hours 7 minutes 43 seconds  Estimated Blood Loss:  Estimated blood loss: none.      [facility]
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
## 2021-11-27 NOTE — Anesthesia Postprocedure Evaluation (Signed)
Anesthesia Post Note  Patient: Jason Conner  Procedure(s) Performed: ESOPHAGOGASTRODUODENOSCOPY (EGD) WITH BIOPSY (Mouth) ESOPHAGEAL DILATION (Mouth)     Patient location during evaluation: PACU Anesthesia Type: General Level of consciousness: awake and alert Pain management: pain level controlled Vital Signs Assessment: post-procedure vital signs reviewed and stable Respiratory status: spontaneous breathing, nonlabored ventilation, respiratory function stable and patient connected to nasal cannula oxygen Cardiovascular status: blood pressure returned to baseline and stable Postop Assessment: no apparent nausea or vomiting Anesthetic complications: no   There were no known notable events for this encounter.  Lenard Simmer

## 2021-11-28 ENCOUNTER — Encounter: Payer: Self-pay | Admitting: Gastroenterology

## 2021-12-02 LAB — SURGICAL PATHOLOGY

## 2021-12-04 ENCOUNTER — Encounter: Payer: Self-pay | Admitting: Gastroenterology

## 2022-03-21 IMAGING — CT CT ANGIO CHEST
2 of 7 series · 18 of 46 positions shown · IV contrast (APPLIED)
Comparison: Chest radiograph 09/04/2020

CLINICAL DATA: Shortness of breath.  Recent shoulder surgery.

EXAM:
CT ANGIOGRAPHY CHEST WITH CONTRAST
TECHNIQUE: Multidetector CT imaging of the chest was performed using the
standard protocol during bolus administration of intravenous
contrast. Multiplanar CT image reconstructions and MIPs were
obtained to evaluate the vascular anatomy.

[Series 5: thins · axial · 0.81mm/px · z∈[-627,-372]mm · 16 of 287 slices shown]
[im 16/287  lung]
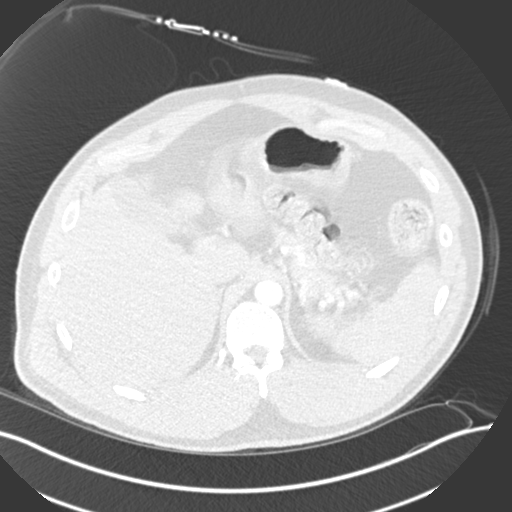
[im 32/287  soft-tissue]
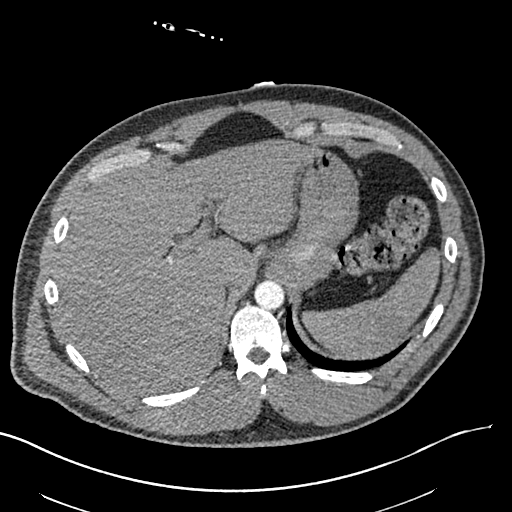
[im 48/287  lung]
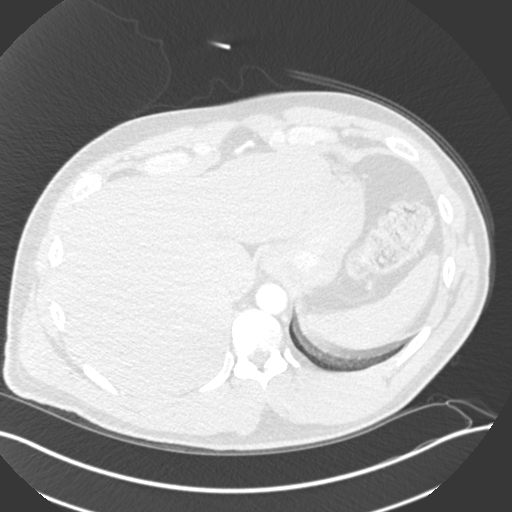
[im 64/287  soft-tissue]
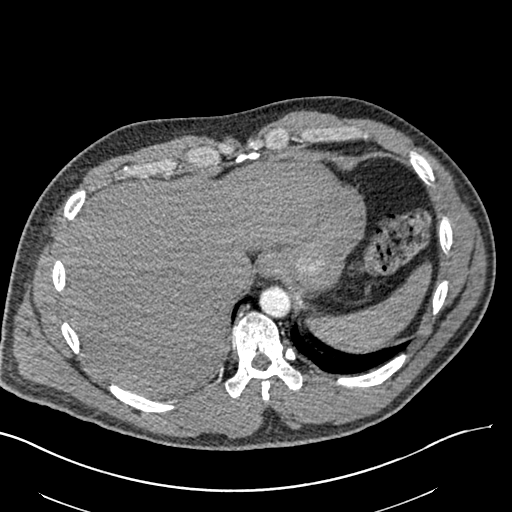
[im 80/287  lung]
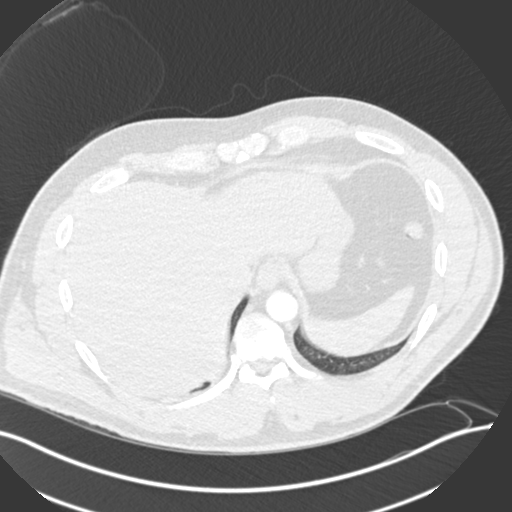
[im 96/287  soft-tissue]
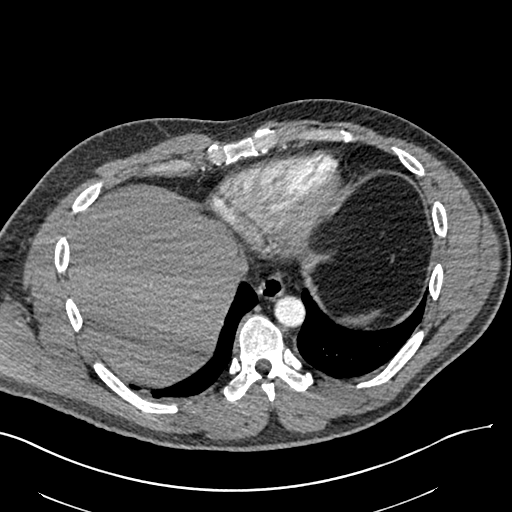
[im 112/287  lung]
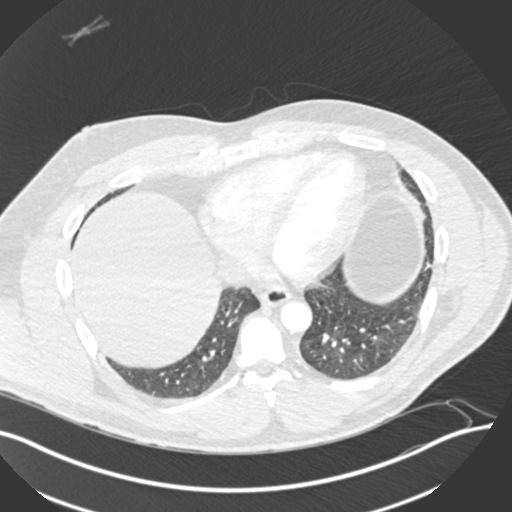
[im 128/287  soft-tissue]
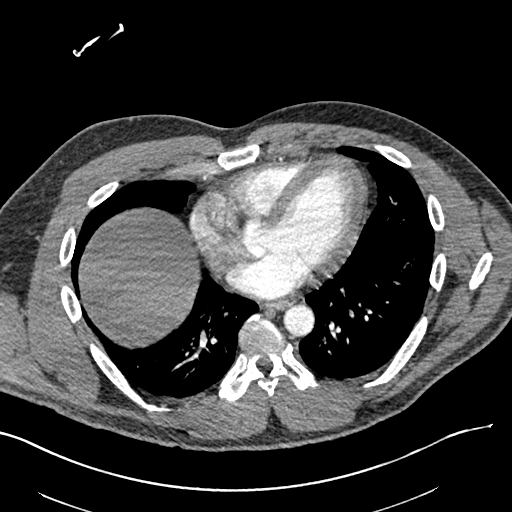
[im 159/287  lung]
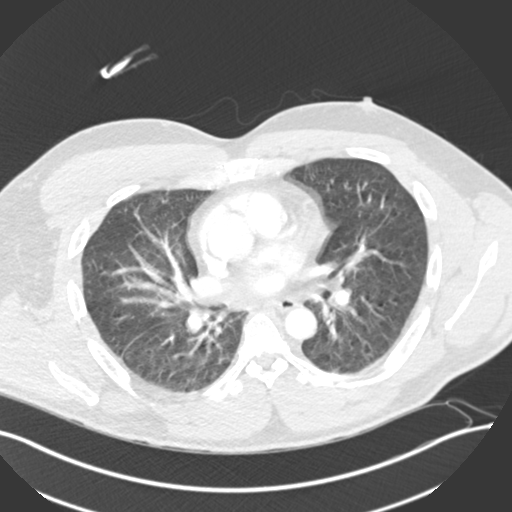
[im 175/287  soft-tissue]
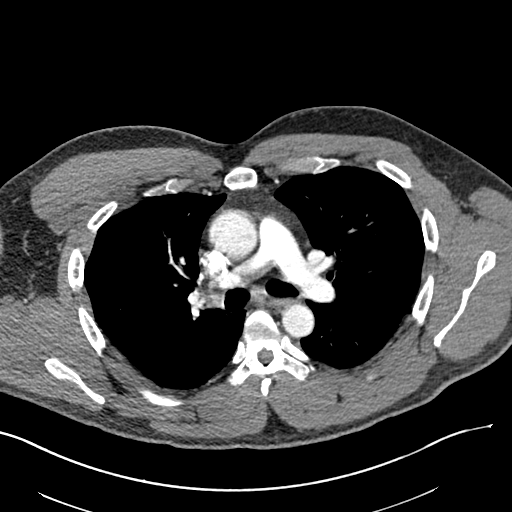
[im 191/287  lung]
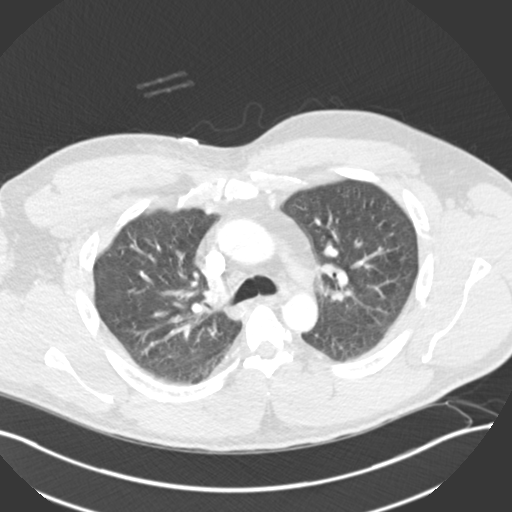
[im 207/287  soft-tissue]
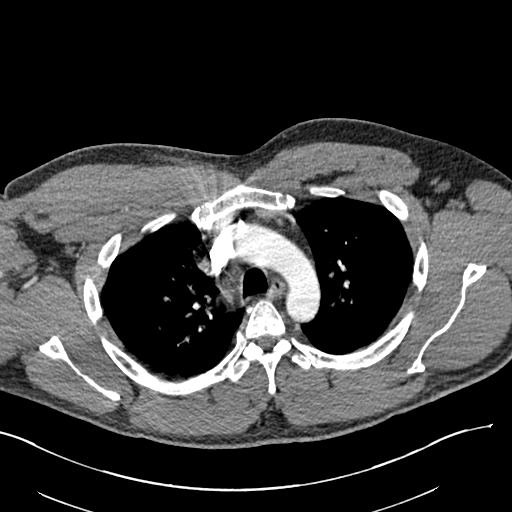
[im 223/287  lung]
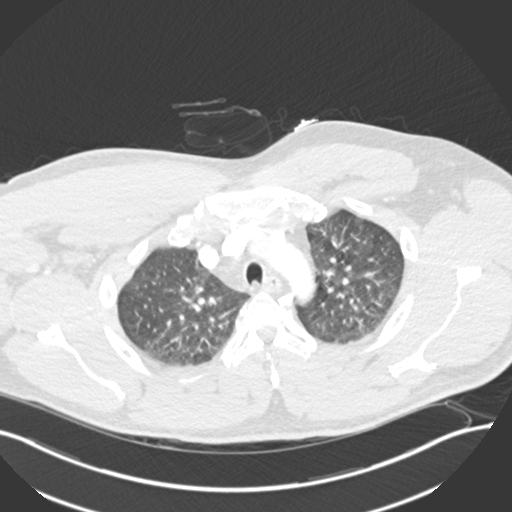
[im 239/287  soft-tissue]
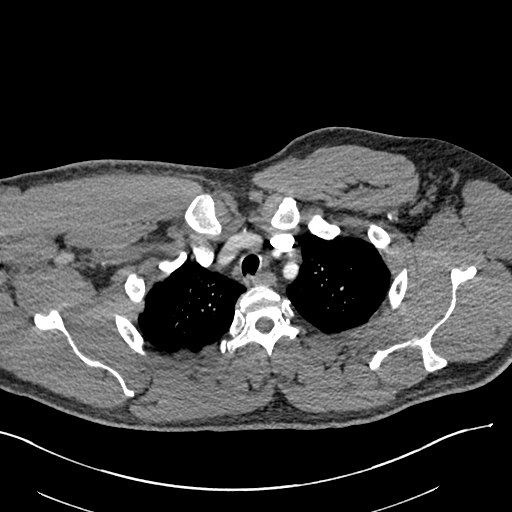
[im 255/287  lung]
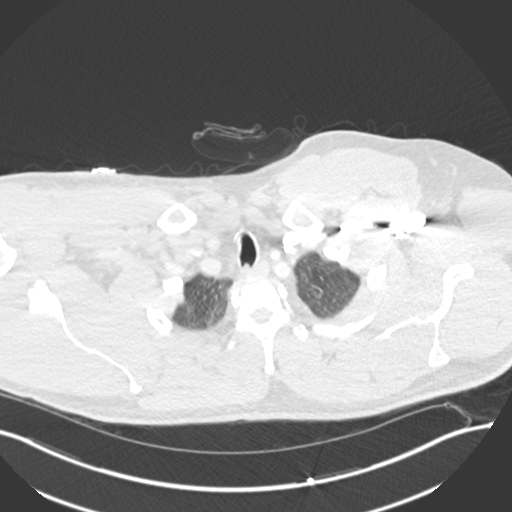
[im 271/287  soft-tissue]
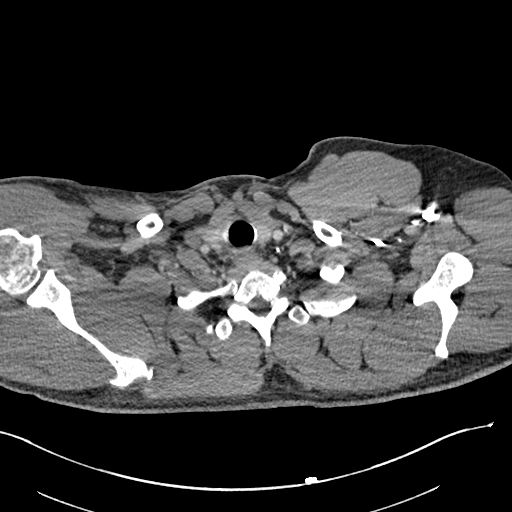

[Series 7: coronal mpr · coronal · 0.58mm/px · 2 of 89 slices shown]
[im 30/89  soft-tissue]
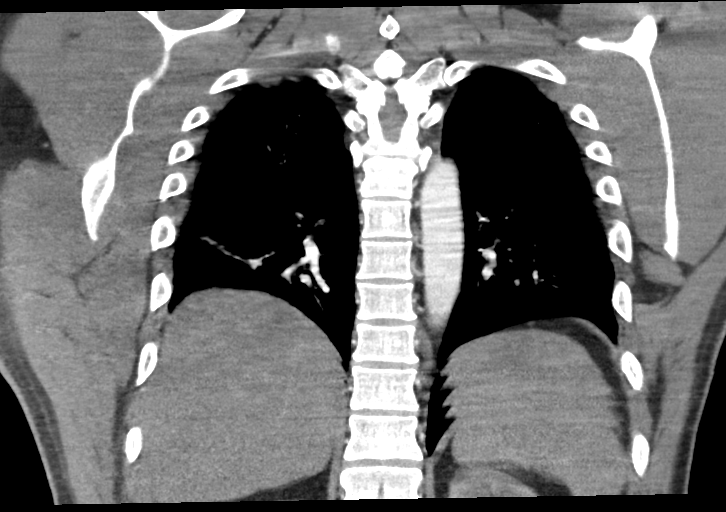
[im 59/89  soft-tissue]
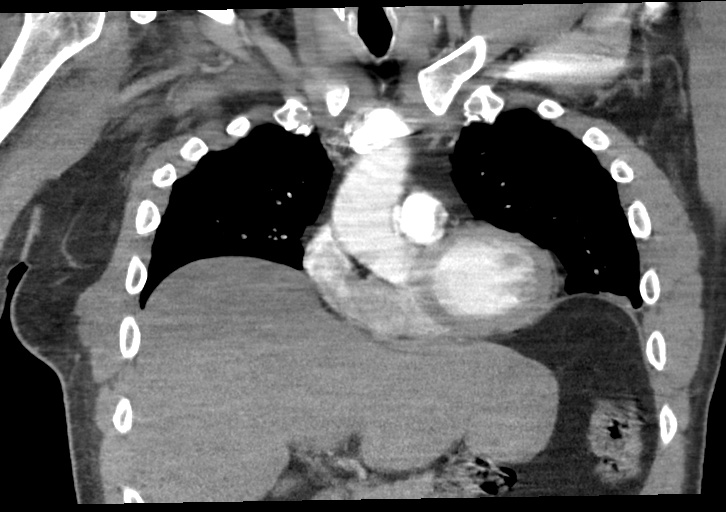

[18 of 46 positions shown; findings below may reference images not displayed]

RADIATION DOSE REDUCTION: This exam was performed according to the
departmental dose-optimization program which includes automated
exposure control, adjustment of the mA and/or kV according to
patient size and/or use of iterative reconstruction technique.

CONTRAST:  100mL OMNIPAQUE IOHEXOL 350 MG/ML SOLN
FINDINGS: Cardiovascular: Limited evaluation for pulmonary emboli due to poor
opacification of the pulmonary arteries and severe motion artifact.
No large filling defect in the main pulmonary arteries but cannot
evaluate the lobar or more distal pulmonary arteries. Normal caliber
of the thoracic aorta. Heart size is normal. No significant
pericardial effusion.

Mediastinum/Nodes: No mediastinal, hilar or axillary lymph node
enlargement.

Lungs/Pleura: Extensive motion artifact limits evaluation of the
lungs. Patchy densities in the right lower lobe could represent
atelectasis. No large areas of airspace disease or consolidation. No
pleural effusions.

Upper Abdomen: Images of the upper abdomen are unremarkable.

Musculoskeletal: No acute bone abnormality. Small amount of
subcutaneous edema in the anterior right chest could be related to
recent shoulder surgery.

Review of the MIP images confirms the above findings.
IMPRESSION: 1. Pulmonary embolism cannot be evaluated on this examination due to
poor opacification of the pulmonary arteries and severe motion
artifact.
2. Limited evaluation of the lungs due to extensive motion artifact
but no large areas of airspace disease or consolidation. Probable
bandlike atelectasis in the right lower lobe.

## 2022-04-30 ENCOUNTER — Other Ambulatory Visit: Payer: Self-pay | Admitting: Student

## 2022-04-30 DIAGNOSIS — G8929 Other chronic pain: Secondary | ICD-10-CM

## 2022-04-30 DIAGNOSIS — S46011D Strain of muscle(s) and tendon(s) of the rotator cuff of right shoulder, subsequent encounter: Secondary | ICD-10-CM

## 2022-04-30 DIAGNOSIS — S43431D Superior glenoid labrum lesion of right shoulder, subsequent encounter: Secondary | ICD-10-CM

## 2022-04-30 DIAGNOSIS — M7581 Other shoulder lesions, right shoulder: Secondary | ICD-10-CM

## 2022-05-14 ENCOUNTER — Ambulatory Visit
Admission: RE | Admit: 2022-05-14 | Discharge: 2022-05-14 | Disposition: A | Discharge status: Home / Self Care | Payer: No Typology Code available for payment source | Source: Ambulatory Visit | Attending: Student | Admitting: Student

## 2022-05-14 DIAGNOSIS — S46011D Strain of muscle(s) and tendon(s) of the rotator cuff of right shoulder, subsequent encounter: Secondary | ICD-10-CM | POA: Diagnosis present

## 2022-05-14 DIAGNOSIS — M19011 Primary osteoarthritis, right shoulder: Secondary | ICD-10-CM | POA: Diagnosis not present

## 2022-05-14 DIAGNOSIS — M7581 Other shoulder lesions, right shoulder: Secondary | ICD-10-CM | POA: Insufficient documentation

## 2022-05-14 DIAGNOSIS — G8929 Other chronic pain: Secondary | ICD-10-CM | POA: Diagnosis not present

## 2022-05-14 DIAGNOSIS — S43431D Superior glenoid labrum lesion of right shoulder, subsequent encounter: Secondary | ICD-10-CM | POA: Insufficient documentation

## 2022-05-14 DIAGNOSIS — X58XXXD Exposure to other specified factors, subsequent encounter: Secondary | ICD-10-CM | POA: Insufficient documentation

## 2022-05-14 MED ORDER — LIDOCAINE HCL (PF) 1 % IJ SOLN
30.0000 mL | Freq: Once | INTRAMUSCULAR | Status: AC
  Administered 2022-05-14: 10 mL

## 2022-05-14 MED ORDER — IOHEXOL 180 MG/ML  SOLN
50.0000 mL | Freq: Once | INTRAMUSCULAR | Status: AC | PRN
  Administered 2022-05-14: 20 mL

## 2022-05-14 MED ORDER — GADOBUTROL 1 MMOL/ML IV SOLN
1.0000 mL | Freq: Once | INTRAVENOUS | Status: AC | PRN
  Administered 2022-05-14: 0.05 mL

## 2022-05-14 MED ORDER — SODIUM CHLORIDE (PF) 0.9 % IJ SOLN
10.0000 mL | INTRAMUSCULAR | Status: DC | PRN
  Administered 2022-05-14: 5 mL via INTRAVENOUS

## 2022-05-15 ENCOUNTER — Other Ambulatory Visit: Payer: Self-pay | Admitting: Student

## 2022-05-15 DIAGNOSIS — G8929 Other chronic pain: Secondary | ICD-10-CM

## 2022-05-15 DIAGNOSIS — S43431D Superior glenoid labrum lesion of right shoulder, subsequent encounter: Secondary | ICD-10-CM

## 2022-05-15 DIAGNOSIS — S46011D Strain of muscle(s) and tendon(s) of the rotator cuff of right shoulder, subsequent encounter: Secondary | ICD-10-CM

## 2022-05-15 DIAGNOSIS — M7581 Other shoulder lesions, right shoulder: Secondary | ICD-10-CM

## 2022-05-21 ENCOUNTER — Ambulatory Visit: Payer: Self-pay | Admitting: Neurosurgery

## 2022-05-26 ENCOUNTER — Encounter: Payer: Self-pay | Admitting: Neurosurgery

## 2022-05-26 ENCOUNTER — Ambulatory Visit (INDEPENDENT_AMBULATORY_CARE_PROVIDER_SITE_OTHER): Payer: Worker's Compensation | Admitting: Neurosurgery

## 2022-05-26 VITALS — BP 122/76 | HR 75 | Ht 75.0 in | Wt 249.0 lb

## 2022-05-26 DIAGNOSIS — G629 Polyneuropathy, unspecified: Secondary | ICD-10-CM | POA: Diagnosis not present

## 2022-05-26 DIAGNOSIS — Z9889 Other specified postprocedural states: Secondary | ICD-10-CM | POA: Diagnosis not present

## 2022-05-26 DIAGNOSIS — M5416 Radiculopathy, lumbar region: Secondary | ICD-10-CM
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
## 2022-06-16 NOTE — Telephone Encounter (Signed)
-----   Message from Loleta Dicker, [place] sent at 05/26/2022  3:08 PM EDT ----- Call workers comp Jason Conner [phone]

## 2022-08-25 ENCOUNTER — Emergency Department
Admission: EM | Admit: 2022-08-25 | Discharge: 2022-08-25 | Disposition: A | Discharge status: Home / Self Care | Payer: Self-pay | Attending: Emergency Medicine | Admitting: Emergency Medicine

## 2022-08-25 ENCOUNTER — Other Ambulatory Visit: Payer: Self-pay

## 2022-08-25 DIAGNOSIS — F445 Conversion disorder with seizures or convulsions: Secondary | ICD-10-CM | POA: Diagnosis not present

## 2022-08-25 DIAGNOSIS — E860 Dehydration: Secondary | ICD-10-CM | POA: Diagnosis not present

## 2022-08-25 DIAGNOSIS — R569 Unspecified convulsions: Secondary | ICD-10-CM | POA: Diagnosis present

## 2022-08-25 LAB — CBC WITH DIFFERENTIAL/PLATELET
Abs Immature Granulocytes: 0.08 10*3/uL — ABNORMAL HIGH (ref 0.00–0.07)
Basophils Absolute: 0.1 10*3/uL (ref 0.0–0.1)
Basophils Relative: 1 %
Eosinophils Absolute: 0.3 10*3/uL (ref 0.0–0.5)
Eosinophils Relative: 2 %
HCT: 55.3 % — ABNORMAL HIGH (ref 39.0–52.0)
Hemoglobin: 17.6 g/dL — ABNORMAL HIGH (ref 13.0–17.0)
Immature Granulocytes: 1 %
Lymphocytes Relative: 57 %
Lymphs Abs: 7.8 10*3/uL — ABNORMAL HIGH (ref 0.7–4.0)
MCH: 30.9 pg (ref 26.0–34.0)
MCHC: 31.8 g/dL (ref 30.0–36.0)
MCV: 97 fL (ref 80.0–100.0)
Monocytes Absolute: 1.1 10*3/uL — ABNORMAL HIGH (ref 0.1–1.0)
Monocytes Relative: 8 %
Neutro Abs: 4.3 10*3/uL (ref 1.7–7.7)
Neutrophils Relative %: 31 %
Platelets: 301 10*3/uL (ref 150–400)
RBC: 5.7 MIL/uL (ref 4.22–5.81)
RDW: 12.9 % (ref 11.5–15.5)
WBC: 13.6 10*3/uL — ABNORMAL HIGH (ref 4.0–10.5)
nRBC: 0 % (ref 0.0–0.2)

## 2022-08-25 LAB — BASIC METABOLIC PANEL
Anion gap: 24 — ABNORMAL HIGH (ref 5–15)
BUN: 17 mg/dL (ref 6–20)
CO2: 16 mmol/L — ABNORMAL LOW (ref 22–32)
Calcium: 10.7 mg/dL — ABNORMAL HIGH (ref 8.9–10.3)
Chloride: 103 mmol/L (ref 98–111)
Creatinine, Ser: 1.74 mg/dL — ABNORMAL HIGH (ref 0.61–1.24)
GFR, Estimated: 49 mL/min — ABNORMAL LOW (ref >60)
Glucose, Bld: 118 mg/dL — ABNORMAL HIGH (ref 70–99)
Potassium: 4 mmol/L (ref 3.5–5.1)
Sodium: 143 mmol/L (ref 135–145)

## 2022-08-25 LAB — CK: Total CK: 192 U/L (ref 49–397)

## 2022-08-25 MED ORDER — HALOPERIDOL LACTATE 5 MG/ML IJ SOLN
5.0000 mg | Freq: Once | INTRAMUSCULAR | Status: AC
  Administered 2022-08-25: 5 mg via INTRAVENOUS
  Filled 2022-08-25: qty 1

## 2022-08-25 MED ORDER — SODIUM CHLORIDE 0.9 % IV BOLUS
1000.0000 mL | Freq: Once | INTRAVENOUS | Status: AC
  Administered 2022-08-25: 1000 mL via INTRAVENOUS

## 2022-08-25 MED ORDER — DIPHENHYDRAMINE HCL 50 MG/ML IJ SOLN
25.0000 mg | Freq: Once | INTRAMUSCULAR | Status: AC
  Administered 2022-08-25: 25 mg via INTRAVENOUS
  Filled 2022-08-25: qty 1

## 2022-08-25 NOTE — ED Notes (Signed)
Pt states pain has decreased, resting with eyes closed.
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
## 2022-08-25 NOTE — ED Provider Notes (Signed)
[facility] Provider Note    Event Date/Time   First MD Initiated Contact with Patient 08/25/22 1044     (approximate)   History   Seizures   HPI  KEVIS QU III is a 42 y.o. male here with seizures.  History initially limited due to seizure-like activity.  Patient has a history of pseudoseizures and was at PT today.  He states that he felt something coming on and then began shaking uncontrollably.  Has history of similar presentations with pseudoseizures in the past.  Wife states it is very similar to what.  He is answering questions during the shaking.  He states he has cramps diffusely, feels lightheaded.  No known specific recent triggers.  No recent medication change.  No recent head trauma.  No focal numbness or weakness.     Physical Exam   Triage Vital Signs: ED Triage Vitals  Enc Vitals Group     BP 08/25/22 1046 (!) 126/98     Pulse Rate 08/25/22 1046 (!) 147     Resp 08/25/22 1046 (!) 24     Temp --      Temp src --      SpO2 08/25/22 1046 100 %     Weight 08/25/22 1047 246 lb (111.6 kg)     Height 08/25/22 1047 6\' 2"  (1.88 m)     Head Circumference --      Peak Flow --      Pain Score 08/25/22 1047 10     Pain Loc --      Pain Edu? --      Excl. in GC? --     Most recent vital signs: Vitals:   08/25/22 1400 08/25/22 1431  BP: 124/81 (!) 143/101  Pulse: 66 71  Resp: 11 15  SpO2: 100% 100%     General: Awake, mild distress from shaking/reported pain "all over.".  CV:  Good peripheral perfusion.  Regular rate and rhythm.  No murmurs. Resp:  Normal work of breathing.  Abd:  No distention.  Other:  Shaking, asking for help, rhythmically contracting bilateral upper and lower extremities but answering questions.   ED Results / Procedures / Treatments   Labs (all labs ordered are listed, but only abnormal results are displayed) Labs Reviewed  CBC WITH DIFFERENTIAL/PLATELET - Abnormal; Notable for the following components:       Result Value   WBC 13.6 (*)    Hemoglobin 17.6 (*)    HCT 55.3 (*)    Lymphs Abs 7.8 (*)    Monocytes Absolute 1.1 (*)    Abs Immature Granulocytes 0.08 (*)    All other components within normal limits  BASIC METABOLIC PANEL - Abnormal; Notable for the following components:   CO2 16 (*)    Glucose, Bld 118 (*)    Creatinine, Ser 1.74 (*)    Calcium 10.7 (*)    GFR, Estimated 49 (*)    Anion gap 24 (*)    All other components within normal limits  CK     EKG Sinus tachycardia, ventricular rate 150.  PR 124, QRS 170, QTc 427.  No acute ST elevations or depressions.  Acute events of acute ischemic infarct.   RADIOLOGY    I also independently reviewed and agree with radiologist interpretations.   PROCEDURES:  Critical Care performed: No   MEDICATIONS ORDERED IN ED: Medications  haloperidol lactate (HALDOL) injection 5 mg (5 mg Intravenous Given 08/25/22 1051)  diphenhydrAMINE (BENADRYL) injection  25 mg (25 mg Intravenous Given 08/25/22 1051)  sodium chloride 0.9 % bolus 1,000 mL (0 mLs Intravenous Stopped 08/25/22 1431)     IMPRESSION / MDM / ASSESSMENT AND PLAN / ED COURSE  I reviewed the triage vital signs and the nursing notes.                              Differential diagnosis includes, but is not limited to, pseudoseizure, seizure, myoclonic jerks, anxiety attack  Patient's presentation is most consistent with acute presentation with potential threat to life or bodily function.  The patient is on the cardiac monitor to evaluate for evidence of arrhythmia and/or significant heart rate changes   42 year old male with past medical history of pseudoseizures here with likely pseudoseizure.  Unclear trigger but symptoms are highly consistent with this and patient is alert despite bilateral shaking consistent with this.  Wife is at bedside and confirms this is his usual presentation.  Patient reportedly only response to Haldol.  This was given with excellent  response and patient is awake, alert, and feels much better after resting briefly.  Lab work is consistent with his degree of distress and shaking, with mild leukocytosis and dehydration.  This is also not uncommon for him.  He has been given IV fluids and is tolerating p.o. and well-appearing.  She notes feel this needs repeat as he is otherwise well-appearing and was well prior to the incident.  I discussed outpatient follow-up with him.  He will consider a low-dose of p.o. Haldol for the next day or 2 if he feels like he is going to have recurrent episode.  Return precautions given.   FINAL CLINICAL IMPRESSION(S) / ED DIAGNOSES   Final diagnoses:  Psychogenic nonepileptic seizure     Rx / DC Orders   ED Discharge Orders     None        Note:  This document was prepared using Dragon voice recognition software and may include unintentional dictation errors.   Shaune Pollack, MD 08/25/22 1515

## 2022-11-08 ENCOUNTER — Other Ambulatory Visit: Payer: Self-pay | Admitting: Gastroenterology
